# Patient Record
Sex: Female | Born: 1985 | Race: Black or African American | Hispanic: No | Smoking: Never smoker
Health system: Southern US, Community
[De-identification: ages and names within clinical notes are randomized; demographics above are authoritative.]

## PROBLEM LIST (undated history)

## (undated) ENCOUNTER — Inpatient Hospital Stay (HOSPITAL_COMMUNITY): Payer: Self-pay

## (undated) DIAGNOSIS — I10 Essential (primary) hypertension: Secondary | ICD-10-CM

## (undated) DIAGNOSIS — F419 Anxiety disorder, unspecified: Secondary | ICD-10-CM

## (undated) DIAGNOSIS — O1490 Unspecified pre-eclampsia, unspecified trimester: Secondary | ICD-10-CM

## (undated) DIAGNOSIS — I509 Heart failure, unspecified: Secondary | ICD-10-CM

## (undated) DIAGNOSIS — R011 Cardiac murmur, unspecified: Secondary | ICD-10-CM

## (undated) DIAGNOSIS — K449 Diaphragmatic hernia without obstruction or gangrene: Secondary | ICD-10-CM

## (undated) DIAGNOSIS — A549 Gonococcal infection, unspecified: Secondary | ICD-10-CM

## (undated) DIAGNOSIS — I5032 Chronic diastolic (congestive) heart failure: Secondary | ICD-10-CM

## (undated) DIAGNOSIS — K219 Gastro-esophageal reflux disease without esophagitis: Secondary | ICD-10-CM

## (undated) DIAGNOSIS — A749 Chlamydial infection, unspecified: Secondary | ICD-10-CM

## (undated) DIAGNOSIS — J811 Chronic pulmonary edema: Secondary | ICD-10-CM

## (undated) DIAGNOSIS — O24419 Gestational diabetes mellitus in pregnancy, unspecified control: Secondary | ICD-10-CM

## (undated) HISTORY — DX: Chronic diastolic (congestive) heart failure: I50.32

## (undated) HISTORY — DX: Cardiac murmur, unspecified: R01.1

## (undated) HISTORY — DX: Gastro-esophageal reflux disease without esophagitis: K21.9

## (undated) HISTORY — DX: Heart failure, unspecified: I50.9

## (undated) HISTORY — DX: Anxiety disorder, unspecified: F41.9

## (undated) HISTORY — PX: DILATION AND CURETTAGE OF UTERUS: SHX78

---
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
## 2007-11-14 ENCOUNTER — Inpatient Hospital Stay (HOSPITAL_COMMUNITY): Admission: AD | Admit: 2007-11-14 | Discharge: 2007-11-14 | Payer: Self-pay | Admitting: Obstetrics & Gynecology

## 2010-06-08 NOTE — Op Note (Signed)
[facility]  Patient:    Savannah Cole, Savannah Cole                        MRN: [id number] Proc. Date: 05/16/99 Adm. Date:  [id number] Attending:  Venita Sheffield                           Operative Report  PREOPERATIVE DIAGNOSIS:       Intrauterine fetal demise at 12 weeks.  POSTOPERATIVE DIAGNOSIS:  OPERATION:  SURGEON:                      Kathreen Cosier, M.D.  ASSISTANT:  ANESTHESIA:                   MAC.  ESTIMATED BLOOD LOSS:  DESCRIPTION OF PROCEDURE:     Using MAC with the patient in the lithotomy position, a laminaria plus packing was removed from the vagina.  The perineum and vagina ere prepped and draped.  The bladder was emptied with a straight catheter.  On bimanual examination, the uterus was 12 weeks size.  A weighted speculum was placed in the vagina.  The cervix was grasped with a tenaculum and the cervix was dilated at  [facility] with 1% Xylocaine 3 cc each.  The cervix was noted to be open and easily  admitted a #31 Pratt.  The sponge forceps were inserted and used to remove the products of conception.  There was a large amount of tissue obtained.  Then using a #11 suction, the endometrial cavity was suctioned and any remaining tissue removed. The patient tolerated the procedure well and taken to the recovery room in good  condition. DD:  05/16/99 TD:  05/16/99 Job: [id number]

## 2010-09-06 ENCOUNTER — Ambulatory Visit (INDEPENDENT_AMBULATORY_CARE_PROVIDER_SITE_OTHER): Payer: Commercial Managed Care - PPO | Admitting: Family Medicine

## 2010-09-06 ENCOUNTER — Ambulatory Visit: Payer: Self-pay | Admitting: Family Medicine

## 2010-09-06 ENCOUNTER — Encounter: Payer: Self-pay | Admitting: Family Medicine

## 2010-09-06 DIAGNOSIS — R000 Tachycardia, unspecified: Secondary | ICD-10-CM

## 2010-09-06 DIAGNOSIS — F411 Generalized anxiety disorder: Secondary | ICD-10-CM

## 2010-09-06 DIAGNOSIS — F419 Anxiety disorder, unspecified: Secondary | ICD-10-CM

## 2010-09-06 LAB — TSH: TSH: 0.59 u[IU]/mL (ref 0.35–5.50)

## 2010-09-06 LAB — CBC WITH DIFFERENTIAL/PLATELET
Basophils Relative: 0.4 % (ref 0.0–3.0)
Eosinophils Relative: 0.3 % (ref 0.0–5.0)
HCT: 39.7 % (ref 36.0–46.0)
Lymphs Abs: 2.1 10*3/uL (ref 0.7–4.0)
MCHC: 33.5 g/dL (ref 30.0–36.0)
MCV: 94 fl (ref 78.0–100.0)
Monocytes Absolute: 0.6 10*3/uL (ref 0.1–1.0)
Neutro Abs: 5.8 10*3/uL (ref 1.4–7.7)
RBC: 4.23 Mil/uL (ref 3.87–5.11)
WBC: 8.5 10*3/uL (ref 4.5–10.5)

## 2010-09-06 LAB — BASIC METABOLIC PANEL
CO2: 28 mEq/L (ref 19–32)
Chloride: 101 mEq/L (ref 96–112)
Potassium: 3.6 mEq/L (ref 3.5–5.1)

## 2010-09-06 LAB — HEPATIC FUNCTION PANEL
ALT: 20 U/L (ref 0–35)
Albumin: 4.7 g/dL (ref 3.5–5.2)
Total Protein: 8.2 g/dL (ref 6.0–8.3)

## 2010-09-06 MED ORDER — ESCITALOPRAM OXALATE 10 MG PO TABS: 10.0000 mg | ORAL_TABLET | Freq: Every day | ORAL | Status: DC

## 2010-09-06 MED ORDER — ALPRAZOLAM 0.25 MG PO TABS: 0.2500 mg | ORAL_TABLET | Freq: Three times a day (TID) | ORAL | Status: DC | PRN

## 2010-09-06 NOTE — Patient Instructions (Signed)
Anxiety and Panic Attacks Your caregiver has informed you that you are having an anxiety or panic attack. There may be many forms of this. Most of the time these attacks come suddenly and without warning. They come at any time of day, including periods of sleep, and at any time of life. They may be strong and unexplained. Although panic attacks are very scary, they are physically harmless. Sometimes the cause of your anxiety is not known. Anxiety is a protective mechanism of the body in its fight or flight mechanism. Most of these perceived danger situations are actually nonphysical situations (such as anxiety over losing a job). CAUSES The causes of an anxiety or panic attack are many. Panic attacks may occur in otherwise healthy people given a certain set of circumstances. There may be a genetic cause for panic attacks. Some medications may also have anxiety as a side effect. SYMPTOMS Some of the most common feelings are:  Intense terror.  Dizziness, feeling faint.   Hot and cold flashes.   Fear of going crazy.   Feelings that nothing is real.   Sweating.   Shaking.   Chest pain or a fast heartbeat (palpitations).  Smothering, choking sensations.   Feelings of impending doom and that death is near.   Tingling of extremities, this may be from over breathing.   Altered reality (derealization).   Being detached from yourself (depersonalization).   Several symptoms can be present to make up anxiety or panic attacks. DIAGNOSIS The evaluation by your caregiver will depend on the type of symptoms you are experiencing. The diagnosis of anxiety or pain attack is made when no physical illness can be determined to be a cause of the symptoms. TREATMENT Treatment to prevent anxiety and panic attacks may include:  Avoidance of circumstances that cause anxiety.   Reassurance and relaxation.   Regular exercise.   Relaxation therapies, such as yoga.   Psychotherapy with a psychiatrist  or therapist.   Avoidance of caffeine, alcohol and illegal drugs.   Prescribed medication.  SEEK IMMEDIATE MEDICAL CARE IF:  You experience panic attack symptoms that are different than your usual symptoms.   You have any worsening or concerning symptoms.  Document Released: 01/07/2005 Document Re-Released: 06/27/2009 Exit[employer] Patient Information 2011 [employer]. 

## 2010-09-06 NOTE — Progress Notes (Signed)
  Subjective:     Savannah Cole is a 25 y.o. female who presents for new evaluation and treatment of anxiety disorder. She has the following anxiety symptoms: difficulty concentrating, palpitations and panic attacks. Onset of symptoms was approximately 3 months ago. Symptoms have been gradually worsening since that time. She denies current suicidal and homicidal ideation. Family history significant for no psychiatric illness. Risk factors: negative life event daughter with autism and stressfull job. Previous treatment includes none. She complains of the following medication side effects: none. The following portions of the patient's history were reviewed and updated as appropriate: allergies, current medications, past family history, past medical history, past social history, past surgical history and problem list.  Review of Systems Pertinent items are noted in HPI.    Objective:    BP 132/72  Pulse 110  Ht 5' 4.5" (1.638 m)  Wt 171 lb (77.565 kg)  BMI 28.90 kg/m2 General appearance: alert, cooperative, appears stated age and no distress Neck: no adenopathy, no carotid bruit, no JVD, supple, symmetrical, trachea midline and thyroid not enlarged, symmetric, no tenderness/mass/nodules Lungs: clear to auscultation bilaterally Heart: regular rate and rhythm, S1, S2 normal, no murmur, click, rub or gallop    Assessment:    anxiety disorder. Possible organic contributing causes are: none.   Plan:    Medications: Lexapro. Labs: Comprehensive metabolic profile, CBC and TSH. Handouts describing disease, natural history, and treatment were given to the patient. Follow up: 1 month.

## 2010-09-07 ENCOUNTER — Encounter: Payer: Self-pay | Admitting: Family Medicine

## 2010-10-01 ENCOUNTER — Other Ambulatory Visit: Payer: Self-pay | Admitting: Family Medicine

## 2010-10-01 DIAGNOSIS — R7989 Other specified abnormal findings of blood chemistry: Secondary | ICD-10-CM

## 2010-10-02 ENCOUNTER — Other Ambulatory Visit (INDEPENDENT_AMBULATORY_CARE_PROVIDER_SITE_OTHER): Payer: Commercial Managed Care - PPO

## 2010-10-02 DIAGNOSIS — R7989 Other specified abnormal findings of blood chemistry: Secondary | ICD-10-CM

## 2010-10-02 LAB — BASIC METABOLIC PANEL
Calcium: 9.1 mg/dL (ref 8.4–10.5)
GFR: 135.29 mL/min (ref >60.00)
Glucose, Bld: 96 mg/dL (ref 70–99)
Sodium: 137 mEq/L (ref 135–145)

## 2010-10-02 LAB — HEMOGLOBIN A1C: Hgb A1c MFr Bld: 5.4 % (ref 4.6–6.5)

## 2010-10-02 NOTE — Progress Notes (Signed)
Labs only

## 2010-10-05 NOTE — Progress Notes (Signed)
Labs only

## 2010-10-11 ENCOUNTER — Ambulatory Visit: Payer: Commercial Managed Care - PPO | Admitting: Family Medicine

## 2010-10-11 DIAGNOSIS — Z0289 Encounter for other administrative examinations: Secondary | ICD-10-CM

## 2011-01-16 ENCOUNTER — Other Ambulatory Visit (HOSPITAL_COMMUNITY)
Admission: RE | Admit: 2011-01-16 | Discharge: 2011-01-16 | Disposition: A | Discharge status: Home / Self Care | Payer: Commercial Managed Care - PPO | Source: Ambulatory Visit | Attending: Obstetrics and Gynecology | Admitting: Obstetrics and Gynecology

## 2011-01-16 ENCOUNTER — Other Ambulatory Visit: Payer: Self-pay | Admitting: Obstetrics and Gynecology

## 2011-01-16 DIAGNOSIS — Z01419 Encounter for gynecological examination (general) (routine) without abnormal findings: Secondary | ICD-10-CM | POA: Insufficient documentation

## 2011-04-12 ENCOUNTER — Other Ambulatory Visit: Payer: Self-pay | Admitting: Obstetrics and Gynecology

## 2011-04-27 ENCOUNTER — Encounter (HOSPITAL_COMMUNITY): Payer: Self-pay | Admitting: *Deleted

## 2011-04-27 ENCOUNTER — Inpatient Hospital Stay (HOSPITAL_COMMUNITY)
Admission: AD | Admit: 2011-04-27 | Discharge: 2011-04-27 | Disposition: A | Discharge status: Home / Self Care | Payer: Commercial Managed Care - PPO | Source: Ambulatory Visit | Attending: Obstetrics and Gynecology | Admitting: Obstetrics and Gynecology

## 2011-04-27 DIAGNOSIS — N949 Unspecified condition associated with female genital organs and menstrual cycle: Secondary | ICD-10-CM | POA: Insufficient documentation

## 2011-04-27 DIAGNOSIS — N898 Other specified noninflammatory disorders of vagina: Secondary | ICD-10-CM

## 2011-04-27 DIAGNOSIS — L293 Anogenital pruritus, unspecified: Secondary | ICD-10-CM | POA: Insufficient documentation

## 2011-04-27 DIAGNOSIS — N899 Noninflammatory disorder of vagina, unspecified: Secondary | ICD-10-CM

## 2011-04-27 HISTORY — DX: Gonococcal infection, unspecified: A54.9

## 2011-04-27 HISTORY — DX: Chlamydial infection, unspecified: A74.9

## 2011-04-27 LAB — URINALYSIS, ROUTINE W REFLEX MICROSCOPIC
Bilirubin Urine: NEGATIVE
Glucose, UA: NEGATIVE mg/dL
Ketones, ur: NEGATIVE mg/dL
Leukocytes, UA: NEGATIVE
Protein, ur: NEGATIVE mg/dL
pH: 6 (ref 5.0–8.0)

## 2011-04-27 LAB — WET PREP, GENITAL
Clue Cells Wet Prep HPF POC: NONE SEEN
Trich, Wet Prep: NONE SEEN

## 2011-04-27 LAB — URINE MICROSCOPIC-ADD ON

## 2011-04-27 MED ORDER — METHYLPREDNISOLONE 4 MG PO KIT: PACK | ORAL | Status: AC
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
## 2011-04-27 NOTE — Discharge Instructions (Signed)
Your wet prep is normal tonight. The cultures will be back in 2 days. Someone will call you if they are positive. Take the medication as directed. You can take benadryl during the day for itching or burning but do not take it in addition to your tylenol pm at night. Follow up with Dr. Dion Body in the office. Return here as needed. DO NOT DOUCHE, DO NOT USE BUBBLE BATH OR SOAP WITH SCENTS, DO NOT USE TAMPONS. USE DOVE SOAP.

## 2011-04-27 NOTE — Progress Notes (Signed)
Pt reports she had IUD removed last week to see if that was the cause of the pain and infection.

## 2011-04-27 NOTE — MAU Note (Signed)
Pt reports having dx with gonorrhea and chlymidia. Treated for both.  Still having symptoms and tested positive for chlymidia ( no knew sexual encounters) Treated with another antibiotic. P tstill having  Symptoms. MD told her to come here for second opinion.

## 2011-04-29 LAB — URINE CULTURE
Colony Count: NO GROWTH
Culture: NO GROWTH

## 2011-04-30 ENCOUNTER — Telehealth (HOSPITAL_COMMUNITY): Payer: Self-pay | Admitting: Nurse Practitioner

## 2011-04-30 NOTE — Telephone Encounter (Signed)
Telephone call to patient regarding positive chlamydia culture, patient notified.  Patient has been treated three times for chlamydia and continues to test positive.  Patient states she is not having sex.  Patient has appointment tomorrow with Dr. Dion Body and will discuss with her, her treatment options. Report faxed to health department.

## 2012-01-22 NOTE — L&D Delivery Note (Signed)
Delivery Note    Called to bedside for delivery.  Upon arrival, pt was crowning.  Pt pushed for ~3 sets.  At 4:58 AM a viable female was delivered via VBAC, Spontaneous (Presentation: ; Occiput Anterior).  APGAR: 1, 6; weight 7 lb 0.7 oz (3195 g).   Placenta status: Intact, Spontaneous Pathology.  Cord: 3 vessels with the following complications: None.  Cord pH: 7.09. Nose and mouth suctioned on perineum. Tight nuchal cord, surgically reduced.  Shoulders delivered easily.  Baby floppy placed at bedside.  NICU called.  Minimal UOP noted during labor.  Foley to gravity placed with sterile technique for strict I/Os.  Urine concentrated, 250 cc.  Anesthesia: Epidural  Episiotomy: None Lacerations: None Suture Repair: n/a Est. Blood Loss (mL): 300  Mom to AICU.  Baby to nursery-stable.  Geryl Rankins 08/16/2012, 6:23 AM

## 2012-01-30 ENCOUNTER — Other Ambulatory Visit: Payer: Self-pay

## 2012-01-30 LAB — OB RESULTS CONSOLE HIV ANTIBODY (ROUTINE TESTING): HIV: NONREACTIVE

## 2012-01-30 LAB — OB RESULTS CONSOLE GC/CHLAMYDIA
Chlamydia: NEGATIVE
Gonorrhea: NEGATIVE

## 2012-02-03 ENCOUNTER — Other Ambulatory Visit: Payer: Self-pay | Admitting: Obstetrics and Gynecology

## 2012-02-03 DIAGNOSIS — O2690 Pregnancy related conditions, unspecified, unspecified trimester: Secondary | ICD-10-CM

## 2012-02-05 ENCOUNTER — Ambulatory Visit (HOSPITAL_COMMUNITY): Payer: Commercial Managed Care - PPO

## 2012-02-06 ENCOUNTER — Ambulatory Visit (HOSPITAL_COMMUNITY)
Admission: RE | Admit: 2012-02-06 | Discharge: 2012-02-06 | Disposition: A | Discharge status: Home / Self Care | Payer: Commercial Managed Care - PPO | Source: Ambulatory Visit | Attending: Obstetrics and Gynecology | Admitting: Obstetrics and Gynecology

## 2012-02-06 ENCOUNTER — Ambulatory Visit (HOSPITAL_COMMUNITY): Payer: Commercial Managed Care - PPO

## 2012-02-06 ENCOUNTER — Encounter (HOSPITAL_COMMUNITY): Payer: Self-pay

## 2012-02-06 DIAGNOSIS — O2620 Pregnancy care for patient with recurrent pregnancy loss, unspecified trimester: Secondary | ICD-10-CM

## 2012-02-06 DIAGNOSIS — O2690 Pregnancy related conditions, unspecified, unspecified trimester: Secondary | ICD-10-CM | POA: Insufficient documentation

## 2012-02-06 NOTE — Consult Note (Signed)
Maternal Fetal Medicine Consultation  Requesting Provider(s): Geryl Rankins, MD  Reason for consultation:  Recurrent pregnancy loss  HPI: Savannah Cole is a 27 yo [id number] currently at 26 1/7 weeks who is seen today for consultation due to history of recurrent pregnancy loss.  Her past OB history is as follows:  1) 2000 - SAB at approx 12 weeks, required D&C 2) 2002 - term C-section for non-reassuring fetal tracing, ? Preeclampsia 3) 2005 - SAB at 8 weeks 4) 2008 - SAB at 12 weeks 5) 2009 - SAB at 8 weeks  Savannah Cole recently had a clinic ultrasound at 8 weeks that documented a fetal heart beat.  She is currently without complaints - denies any vaginal bleeding or cramping.  She is currently on Prometrium supplementation. Her current pregnancy is with a different father from her first delivery.  OB History: OB History    Grav Para Term Preterm Abortions TAB SAB Ect Mult Living   5 1 1  3  3   1       PMH:  Past Medical History  Diagnosis Date  . Anxiety   . Gonorrhea   . Chlamydia     PSH:  Past Surgical History  Procedure Date  . Cesarean section   . Dilation and curettage of uterus     due to miscarriage   Meds: Prometrium, Tylenol PM, OTC nausea medications  Allergies: NKDA  FH: daughter with "severe" autism.  Reports that genetics work up thus far has been unrevealing.  Soc: denies tobacco, ETOH or illicit drug use  Review of Systems: no vaginal bleeding or cramping/contractions, no LOF, no nausea/vomiting. All other systems reviewed and are negative.    PE:  129/79, 83, 180#   A/P: 1) Single IUP at 9 1/7 weeks         2) Hx of recurrent pregnancy loss - we discussed the differential diagnosis / work up for recurrent pregnancy loss. Concur with progesterone supplementation through the first trimester for possible luteal phase abnormality.  The fact that the patient had a previous livebirth lowers her risk somewhat.  At this point, would not recommend  parental karyotypes and the full work up - but would recommend an antiphospholipid evaluation - Anticardiolipin antibodies, lupus anticoagulant and beta-2 glycoproteins antibodies were ordered at the time of the patients evaluation. Recommend follow up ultrasound at 18 weeks for anatomy which was scheduled at the patient's clinic visit.          3) History of GDM with first pregnancy - recommend early screening test          4) History of preeclampsia with first pregnancy - recommend baseline preeclampsia labs and 24-hr urine protein.   Thank you for the opportunity to be a part of the care of Savannah Cole. Please contact our office if we can be of further assistance.   I spent approximately 30 minutes with this patient with over 50% of time spent in face-to-face counseling.  Alpha Gula, MD Maternal Fetal Medicine

## 2012-02-07 LAB — BETA-2-GLYCOPROTEIN I ABS, IGG/M/A: Beta-2 Glyco I IgG: 0 G Units (ref <20)

## 2012-02-07 LAB — LUPUS ANTICOAGULANT PANEL
DRVVT: 32.7 secs (ref <42.9)
Lupus Anticoagulant: NOT DETECTED

## 2012-02-07 LAB — CARDIOLIPIN ANTIBODIES, IGG, IGM, IGA
Anticardiolipin IgA: 4 APL U/mL — ABNORMAL LOW (ref <22)
Anticardiolipin IgM: 5 MPL U/mL — ABNORMAL LOW (ref <11)

## 2012-02-12 ENCOUNTER — Telehealth (HOSPITAL_COMMUNITY): Payer: Self-pay | Admitting: *Deleted

## 2012-02-13 ENCOUNTER — Telehealth (HOSPITAL_COMMUNITY): Payer: Self-pay | Admitting: *Deleted

## 2012-02-13 NOTE — Telephone Encounter (Signed)
Patient called back today for lab results.  Reviewed with Dr. Sherrie George and found to be wnl.  Pt verbalized understanding.

## 2012-02-14 ENCOUNTER — Other Ambulatory Visit (HOSPITAL_COMMUNITY)
Admission: RE | Admit: 2012-02-14 | Discharge: 2012-02-14 | Disposition: A | Discharge status: Home / Self Care | Payer: Commercial Managed Care - PPO | Source: Ambulatory Visit | Attending: Obstetrics and Gynecology | Admitting: Obstetrics and Gynecology

## 2012-02-14 ENCOUNTER — Other Ambulatory Visit: Payer: Self-pay | Admitting: Obstetrics and Gynecology

## 2012-02-14 DIAGNOSIS — Z01419 Encounter for gynecological examination (general) (routine) without abnormal findings: Secondary | ICD-10-CM | POA: Insufficient documentation

## 2012-04-07 ENCOUNTER — Other Ambulatory Visit: Payer: Self-pay | Admitting: Obstetrics and Gynecology

## 2012-04-07 DIAGNOSIS — Z0489 Encounter for examination and observation for other specified reasons: Secondary | ICD-10-CM

## 2012-04-09 ENCOUNTER — Ambulatory Visit (HOSPITAL_COMMUNITY)
Admission: RE | Admit: 2012-04-09 | Discharge: 2012-04-09 | Disposition: A | Discharge status: Home / Self Care | Payer: Commercial Managed Care - PPO | Source: Ambulatory Visit | Attending: Obstetrics and Gynecology | Admitting: Obstetrics and Gynecology

## 2012-04-09 DIAGNOSIS — O358XX0 Maternal care for other (suspected) fetal abnormality and damage, not applicable or unspecified: Secondary | ICD-10-CM | POA: Insufficient documentation

## 2012-04-09 DIAGNOSIS — Z1389 Encounter for screening for other disorder: Secondary | ICD-10-CM | POA: Insufficient documentation

## 2012-04-09 DIAGNOSIS — O10019 Pre-existing essential hypertension complicating pregnancy, unspecified trimester: Secondary | ICD-10-CM | POA: Insufficient documentation

## 2012-04-09 DIAGNOSIS — Z363 Encounter for antenatal screening for malformations: Secondary | ICD-10-CM | POA: Insufficient documentation

## 2012-05-24 ENCOUNTER — Inpatient Hospital Stay (HOSPITAL_COMMUNITY)
Admission: AD | Admit: 2012-05-24 | Discharge: 2012-05-24 | Disposition: A | Discharge status: Home / Self Care | Payer: Commercial Managed Care - PPO | Source: Ambulatory Visit | Attending: Obstetrics and Gynecology | Admitting: Obstetrics and Gynecology

## 2012-05-24 ENCOUNTER — Inpatient Hospital Stay (HOSPITAL_COMMUNITY): Payer: Commercial Managed Care - PPO

## 2012-05-24 ENCOUNTER — Encounter (HOSPITAL_COMMUNITY): Payer: Self-pay | Admitting: *Deleted

## 2012-05-24 DIAGNOSIS — Y92410 Unspecified street and highway as the place of occurrence of the external cause: Secondary | ICD-10-CM | POA: Insufficient documentation

## 2012-05-24 DIAGNOSIS — O99891 Other specified diseases and conditions complicating pregnancy: Secondary | ICD-10-CM | POA: Insufficient documentation

## 2012-05-24 HISTORY — DX: Gestational diabetes mellitus in pregnancy, unspecified control: O24.419

## 2012-05-24 HISTORY — DX: Unspecified pre-eclampsia, unspecified trimester: O14.90

## 2012-05-24 HISTORY — DX: Essential (primary) hypertension: I10

## 2012-05-24 LAB — CBC
HCT: 36.5 % (ref 36.0–46.0)
Hemoglobin: 12.4 g/dL (ref 12.0–15.0)
MCH: 30.1 pg (ref 26.0–34.0)
MCHC: 34 g/dL (ref 30.0–36.0)
RDW: 12.9 % (ref 11.5–15.5)

## 2012-05-24 LAB — APTT: aPTT: 26 seconds (ref 24–37)

## 2012-05-24 LAB — FIBRINOGEN: Fibrinogen: 451 mg/dL (ref 204–475)

## 2012-05-24 MED ORDER — LABETALOL HCL 100 MG PO TABS: ORAL_TABLET | ORAL | Status: DC
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
## 2012-05-25 LAB — ABO/RH: ABO/RH(D): B POS
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
## 2012-06-04 LAB — OB RESULTS CONSOLE HEPATITIS B SURFACE ANTIGEN: Hepatitis B Surface Ag: NEGATIVE

## 2012-06-04 LAB — OB RESULTS CONSOLE RPR: RPR: NONREACTIVE

## 2012-08-07 ENCOUNTER — Inpatient Hospital Stay (HOSPITAL_COMMUNITY)
Admission: AD | Admit: 2012-08-07 | Discharge: 2012-08-18 | DRG: 774 | Disposition: A | Discharge status: Home / Self Care | Payer: Commercial Managed Care - PPO | Source: Ambulatory Visit | Attending: Obstetrics and Gynecology | Admitting: Obstetrics and Gynecology

## 2012-08-07 ENCOUNTER — Encounter (HOSPITAL_COMMUNITY): Payer: Self-pay | Admitting: *Deleted

## 2012-08-07 ENCOUNTER — Observation Stay (HOSPITAL_COMMUNITY): Payer: Commercial Managed Care - PPO

## 2012-08-07 ENCOUNTER — Other Ambulatory Visit: Payer: Self-pay | Admitting: Obstetrics and Gynecology

## 2012-08-07 DIAGNOSIS — O119 Pre-existing hypertension with pre-eclampsia, unspecified trimester: Secondary | ICD-10-CM | POA: Diagnosis present

## 2012-08-07 DIAGNOSIS — IMO0002 Reserved for concepts with insufficient information to code with codable children: Principal | ICD-10-CM | POA: Diagnosis present

## 2012-08-07 DIAGNOSIS — I10 Essential (primary) hypertension: Secondary | ICD-10-CM | POA: Diagnosis present

## 2012-08-07 DIAGNOSIS — O34219 Maternal care for unspecified type scar from previous cesarean delivery: Secondary | ICD-10-CM | POA: Diagnosis present

## 2012-08-07 MED ORDER — CALCIUM CARBONATE ANTACID 500 MG PO CHEW
2.0000 | CHEWABLE_TABLET | ORAL | Status: DC | PRN
  Administered 2012-08-07: 400 mg via ORAL
  Filled 2012-08-07: qty 2

## 2012-08-07 MED ORDER — ACETAMINOPHEN 325 MG PO TABS
650.0000 mg | ORAL_TABLET | ORAL | Status: DC | PRN
  Administered 2012-08-09 – 2012-08-15 (×14): 650 mg via ORAL
  Filled 2012-08-07 (×15): qty 2

## 2012-08-07 MED ORDER — ZOLPIDEM TARTRATE 5 MG PO TABS
5.0000 mg | ORAL_TABLET | Freq: Every evening | ORAL | Status: DC | PRN
  Administered 2012-08-08 – 2012-08-14 (×8): 5 mg via ORAL
  Filled 2012-08-07 (×8): qty 1

## 2012-08-07 MED ORDER — DOCUSATE SODIUM 100 MG PO CAPS
100.0000 mg | ORAL_CAPSULE | Freq: Every day | ORAL | Status: DC
  Administered 2012-08-08 – 2012-08-15 (×7): 100 mg via ORAL
  Filled 2012-08-07 (×9): qty 1

## 2012-08-07 MED ORDER — PRENATAL MULTIVITAMIN CH
1.0000 | ORAL_TABLET | Freq: Every day | ORAL | Status: DC
  Administered 2012-08-08 – 2012-08-15 (×8): 1 via ORAL
  Filled 2012-08-07 (×9): qty 1

## 2012-08-07 NOTE — Progress Notes (Signed)
Discussed with pt her desire for no transfusions during the course of her admission. The risks and consequences of this decision d/t her diagnosis were clearly explained with teach back methods, it was made clear while the choice is ultimately hers to make and the staff and physicians would respect her wishes completely. The patient stated with conviction that if it came to a life and death situation she would want the physicians to do what was necessary to sustain her life at this point the patient refused to sign the "Refusal for all Blood or Blood Product" form.

## 2012-08-07 NOTE — Progress Notes (Signed)
Patient ID: [name], female   DOB: 06/25/1985, 27 y.o.   MRN: [id number] Savannah Cole is a 27 y.o. [id number] at [redacted]w[redacted]d by ultrasound admitted for r/o pre-eclampsia.  Pt has chronic hypertension which never required medication prior to pregnancy.  She states she has not taken her prescribed labetalol for at least 2 months.  Subjective: She denies current headache, visual changes, upper abdominal pain, nausea or vomiting.        Objective: BP 142/87  Pulse 97  Temp(Src) 98.5 F (36.9 C) (Oral)  Resp 20  Ht 5\' 5"  (1.651 m)  Wt 217 lb 8 oz (98.657 kg)  BMI 36.19 kg/m2  LMP 12/04/2011   Labs: Lab Results  Component Value Date   WBC 12.1* 05/24/2012   HGB 12.4 05/24/2012   HCT 36.5 05/24/2012   MCV 88.6 05/24/2012   PLT 307 05/24/2012    Assessment / Plan IUP at 35 wks 4 days Chronic hypertension currently on no meds, with elevated bp on today's evaluation Hx pre-eclampsia first pregnancy Hx prior c/s for failed induction for pre-eclampsia at term High risk for [facility] Desires TOLAC for attempted vaginal birth.  States she and Dr. Gunnar Bulla have discussed this option in detail along with the associated risks and benefits.  She states that she understands that if she requires delivery with an unfavorable cervix, she may be advised that repeat C/S is her best option. Desires no blood transfusion unless absolutely necessary.  We discussed her risk for PPH and that we would prepare the appropriate samples to have availble for life threatening bleeding.  PLAN Repeat PIH labs in am Restart Labetalol for BP consistently >150/90 Growth U/S in am Discussed possible need of C/S and transfusion.Pt 's questions were answered  Fetal Wellbeing:  Category I   Savannah Cole P 08/07/2012, 9:09 PM

## 2012-08-07 NOTE — Progress Notes (Signed)
Patient currently does not want to sign the Refusal of all blood and blood product form. She states" in a life and death situation I desire the MD to make the decision of need.

## 2012-08-07 NOTE — H&P (Signed)
Reason for Appointment  1. 1 week OB/NST   History of Present Illness   General:  27 y/o G6 P1041 at 32 2/7 weeks confirmed with an 11 week ultrasound presented for routine twice weekly NST. Pt with c/o of sudden lower extremity swelling. 2+ protein urine dip, pt perviously dipped negative. Baseline 24 hour urine was 151. Denies RUQ, visual changes. Headaches are intermittent but she has had headaches throughout pregnancy. Good fetal movement. Pt with significant fatigue but has not worsened from previous. She denies SOB at rest but had difficulty catching her breath this am but that resolved. Denies LOF, VB. Pregnancy complicated by HTN diagnosed in the first trimester. As high as 160s. Labetalol 100 mg BID started but pt discontinued due to headaches. Without medication 120s-140s/80s-90s. BP increased so Labetalol was resumed at 50 mg BID. Pt states she has not taken her BP in the last few days. H/o recurrent miscarriage, S/p Prometrium in the first trimester.   Current Medications   Taking CitraNatal DHA 27-1 & 250 MG Miscellaneous 1 tablet Once daily  Taking Prenatal Vitamins 0.8 MG Tablet as directed   Taking Prometrium 100 MG Capsule as directed 2, Notes: Discontinued after the first trimester  Taking Zofran ODT 4 MG Tablet Dispersible as directed Take 1 tablet every 8 hours, Notes: 1st trimester  Taking Diflucan 150 MG Tablet 1 tablet Once a day  Taking Terazol 3 0.8 % Cream 1 application at bedtime Once a day  Discontinued Labetalol HCl 100 MG Tablet 1 tablet every 12 hours   Past Medical History  Vulvadynia  H/o Gestational DM 2002   Surgical History   D&C 2002  C/S 2002   Family History   Father: alive, Hypertension  Mother: alive  Paternal Grand Father: deceased  Paternal Grand Mother: alive, Diabetes  Maternal Grand Father: alive  Maternal Grand Mother: alive, Diabetes, Hypertension, stroke  Brother 1: alive  Sister 1: alive  Sister 2: alive  Sister 3: alive   denies any GYN family cancer hx.   Social History   General:  Tobacco use  cigarettes: Never smoked no Smoking.  no Tobacco Exposure.  no Alcohol.  Caffeine: yes, coffee, rare, soda, tea.  no Recreational drug use.  Occupation: employed, [employer]- CMA.  Marital Status: Separated.  Children: 1, girls.  Seat belt use: yes.    Gyn History   Sexual activity sexually active.  Periods : regular.  LMP 12/02/11.  Birth control none.  Last pap smear date 01/16/11, WNL.  Denies H/O Last mammogram date.  Abnormal pap smear no.  STD Chlamydia, Gonorrhea.  Menarche 12.    OB History   Number of pregnancies 6.  Pregnancy # 1 miscarriage 2001.  Pregnancy # 2 live birth, C-section, girl 02/2000.  Pregnancy # 3 miscarriage 2009.  Pregnancy # 4: miscarriage 2009.  Pregnancy # 5: Miscarriage 2010.  Pregnancy # 6 current.    Hospitalization/Major Diagnostic Procedure  child birth 02/2000   Vital Signs   Wt 218, Wt change 38 lb, Pulse sitting 130/90, BP sitting 140/90 (repeat).   Physical Examination  GENERAL:  Patient appears alert and oriented.  General Appearance: well-appearing, well-developed, no acute distress.  Speech: clear.  LUNGS:  General clear bilaterally, no crackles, no wheezes.  HEART:  Heart sounds: normal, RRR.  ABDOMEN:  General: Gravid, no RUQ tenderness.  FEMALE GENITOURINARY:  Cervix Closed, Ext os-FT,mod consistency, midposition.  EXTREMITIES:  General: 2+ edema, no calf tenderness.  NEUROLOGICAL:  Reflexes: 2+ bilaterally  and symmetric.    Assessments  1. HTN in pregnancy, chronic - 642.00 (Primary), R/o superimposed preeclampsia., H/o Preeclampsia  2. Proteinuria complicating pregnancy in third trimester - 646.23, 2+ in office.  Treatment  1. HTN in pregnancy, chronic  LAB: CBC without Diff LAB: Comp Metabolic Panel LAB: Uric Acid LAB: LDH (161096)  LAB: Total Protein/Creat Urine (Random) (045409)  LAB: 24-Hr Protein Urine  ([id number]) (Ordered for 08/10/2012)  Notes: Admit to [facility] for observation. Fetal monitoring. Ultrasound ordered to assess EFW and AFI. Pt counseled on management plan, bedrest and 24 hour urine collection. Pt desires to VBAC. Counseled if delivery recommended due to severe preeclampsia, repeat c-section would be advised due to unfavorable cervix.   Procedures  Venipuncture:  Venipuncture: Smith,Michele 08/07/2012 12:42:22 PM > , performed in right arm.          Labs  Lab: CBC without Diff  WBC 8.7  4.0-11.0 - K/ul  RBC 4.37  4.20-5.40 - M/uL  HCT 38.9  37.0-47.0 - %  HGB 13.0  12.0-16.0 - g/dL  MCV 78.2  95.6-21.3 - fL  MCH 29.7  27.0-33.0 - pg  MCHC 33.4  32.0-36.0 - g/dL  RDW 08.6  57.8-46.9 - %  PLT 210  150-400 - K/uL          Lab: Comp Metabolic Panel  GLUCOSE 96  70-99 - mg/dL  BUN 8  6-29 - mg/dL  CREATININE 5.28 L 4.13-2.44 - mg/dl  eGFR (NON-AFRICAN AMERICAN) 125  >60 - calc  eGFR (AFRICAN AMERICAN) 151  >60 - calc  SODIUM 134 L 136-145 - mmol/L  POTASSIUM 4.1  3.5-5.5 - mmol/L  CHLORIDE 105  98-107 - mmol/L  C02 22  22-32 - mmol/L  ANION GAP 11.7  6.0-20.0 - mmol/L  CALCIUM 9.6  8.6-10.3 - mg/dL  T PROTEIN 6.1  0.1-0.2 - g/dL  ALBUMIN 3.3 L 7.2-5.3 - g/dL  T.BILI 0.4  0.3-1.0 - mg/dL  ALP 664  40-347 - U/L  AST 30  0-39 - U/L  ALT 33  0-52 - U/L          Lab: Uric Acid  URIC ACID 5.2   2.3-6.6 - mg/dL

## 2012-08-08 LAB — COMPREHENSIVE METABOLIC PANEL
ALT: 28 U/L (ref 0–35)
AST: 28 U/L (ref 0–37)
Albumin: 2.3 g/dL — ABNORMAL LOW (ref 3.5–5.2)
Calcium: 9.4 mg/dL (ref 8.4–10.5)
Chloride: 102 mEq/L (ref 96–112)
Creatinine, Ser: 0.58 mg/dL (ref 0.50–1.10)
Sodium: 135 mEq/L (ref 135–145)
Total Bilirubin: 0.2 mg/dL — ABNORMAL LOW (ref 0.3–1.2)

## 2012-08-08 LAB — CBC
MCV: 86.9 fL (ref 78.0–100.0)
Platelets: 201 10*3/uL (ref 150–400)
RBC: 4.2 MIL/uL (ref 3.87–5.11)
RDW: 14.1 % (ref 11.5–15.5)
WBC: 7.5 10*3/uL (ref 4.0–10.5)

## 2012-08-08 LAB — CREATININE CLEARANCE, URINE, 24 HOUR
Collection Interval-CRCL: 24 hours
Creatinine Clearance: 206 mL/min — ABNORMAL HIGH (ref 75–115)
Creatinine, 24H Ur: 1718 mg/d (ref 700–1800)
Creatinine, Urine: 169.31 mg/dL
Creatinine: 0.58 mg/dL (ref 0.50–1.10)

## 2012-08-08 LAB — PROTEIN, URINE, 24 HOUR
Protein, 24H Urine: 881 mg/d — ABNORMAL HIGH (ref 50–100)
Urine Total Volume-UPROT: 1115 mL

## 2012-08-08 MED ORDER — LACTATED RINGERS IV SOLN
INTRAVENOUS | Status: DC
  Administered 2012-08-08: 21:00:00 via INTRAVENOUS
  Administered 2012-08-08: 125 mL/h via INTRAVENOUS
  Administered 2012-08-09 – 2012-08-15 (×2): via INTRAVENOUS

## 2012-08-08 MED ORDER — FAMOTIDINE 20 MG PO TABS
20.0000 mg | ORAL_TABLET | Freq: Two times a day (BID) | ORAL | Status: DC
  Administered 2012-08-08 – 2012-08-15 (×14): 20 mg via ORAL
  Filled 2012-08-08 (×13): qty 1

## 2012-08-08 MED ORDER — LACTATED RINGERS IV BOLUS (SEPSIS)
500.0000 mL | Freq: Once | INTRAVENOUS | Status: AC
  Administered 2012-08-08: 500 mL via INTRAVENOUS

## 2012-08-08 MED ORDER — RANITIDINE HCL 150 MG/10ML PO SYRP: 150.0000 mg | ORAL_SOLUTION | Freq: Two times a day (BID) | ORAL | Status: DC

## 2012-08-08 NOTE — Progress Notes (Signed)
24 hr. Urine complete for creatinine and protein.  Sent to lab.

## 2012-08-08 NOTE — Progress Notes (Signed)
OOB to BR.  FM off.

## 2012-08-08 NOTE — Progress Notes (Signed)
Pt. To be on strict I&O.  Will start IVF.  Encouraged to drink more fluids.

## 2012-08-08 NOTE — Progress Notes (Signed)
Notified Dr. Pennie Rushing of frequent UCs..  Encouraged pt. To drink lots of water.  Dr. Pennie Rushing to come in to examine pt. This afternoon.

## 2012-08-08 NOTE — Progress Notes (Signed)
Patient ID: Savannah Cole, female   DOB: 04-Feb-1985, 27 y.o.   MRN: [id number] Savannah Cole is a 27 y.o. G4W1027 at [redacted]w[redacted]d by LMP admitted for r/o pre eclampsia  Subjective: GI: heartburn or reflux          Objective: BP 139/92  Pulse 108  Temp(Src) 99 F (37.2 C) (Oral)  Resp 20  Ht 5\' 5"  (1.651 m)  Wt 217 lb 8 oz (98.657 kg)  BMI 36.19 kg/m2  SpO2 100%  LMP 12/04/2011 UC:   Fewer contractions since bolus of IV fluids SVE:   Dilation: Fingertip Effacement (%): Thick Exam by:: Haygood  Labs: Lab Results  Component Value Date   WBC 7.5 08/08/2012   HGB 12.2 08/08/2012   HCT 36.5 08/08/2012   MCV 86.9 08/08/2012   PLT 201 08/08/2012  .results  Assessment / Plan: IUP at [redacted]w[redacted]d Mild pre-eclampsia superimposed on chronic hypertension which has required no medication  Prior c/s with desire for TOLAC and VBAC Unfavorable cervix Preterm contractions, mild and without cervical change Appropriately grown fetus Fetal Wellbeing:  Category I  Plan VBAC consent form given to the pt and discussed risks of TOLAC especially risk of uterine rupture with induction of labor Options for management of mild preeclampsia reviewed: 1) delivery by repeat c/s  2) induction of labor accepting the increased risk of c/s because of unfavorable cervix and increased risk of uterine scar dehiscence/rupture because of induction 3) await spontaneous labor as long as no signs or sx of severe pre eclpampsia occur in which case immediate delivery would be recommended. Pt wants to review VBAC consent and discuss with her fiance'  HAYGOOD,VANESSA P 08/08/2012, 7:20 PM

## 2012-08-08 NOTE — Progress Notes (Addendum)
Patient ID: Savannah Cole, female   DOB: 1985/07/20, 27 y.o.   MRN: [id number] Savannah Cole is a 27 y.o. [id number] at [redacted]w[redacted]d by LMP admitted for rule out preeclampsia  Subjective: GI: Denies nausea, vomiting or diarrhea., Complains of, heartburn and reflux GU: Denies: dysuria, frequency/urgency, genital discharge, vaginal bleeding.  Denies any change in the pelvic pain that she has experienced throughout OB: Good fetal movement denies specific contractions        Objective: BP 139/92  Pulse 101  Temp(Src) 98.8 F (37.1 C) (Oral)  Resp 20  Ht 5\' 5"  (1.651 m)  Wt 217 lb 8 oz (98.657 kg)  BMI 36.19 kg/m2  LMP 12/04/2011    BP:132-139/63-92 without meds  FHT:  FHR: 150s bpm, variability: moderate,  accelerations:  Present,  decelerations:  Absent UC:   irregular, every 3 and minutes.  Pt rates them 1-2/10 SVE:   Dilation: Fingertip Effacement (%): Thick Exam by:: Haygood  Labs: Lab Results  Component Value Date   WBC 7.5 08/08/2012   HGB 12.2 08/08/2012   HCT 36.5 08/08/2012   MCV 86.9 08/08/2012   PLT 201 08/08/2012   Results for orders placed during the hospital encounter of 08/07/12 (from the past 24 hour(s))  TYPE AND SCREEN     Status: None   Collection Time    08/07/12  8:45 PM      Result Value Range   ABO/RH(D) B POS     Antibody Screen POS     Sample Expiration 08/10/2012     Antibody Identification NO CLINICALLY SIGNIFICANT ANTIBODY IDENTIFIED.     DAT, IgG NEG     Unit Number [id number]     Blood Component Type RED CELLS,LR     Unit division 00     Status of Unit REL FROM [facility]     Transfusion Status OK TO TRANSFUSE     Crossmatch Result COMPATIBLE     Unit Number [id number]     Blood Component Type RED CELLS,LR     Unit division 00     Status of Unit ALLOCATED     Transfusion Status OK TO TRANSFUSE     Crossmatch Result COMPATIBLE    CBC     Status: None   Collection Time    08/08/12  7:13 AM      Result Value Range   WBC 7.5  4.0 - 10.5  K/uL   RBC 4.20  3.87 - 5.11 MIL/uL   Hemoglobin 12.2  12.0 - 15.0 g/dL   HCT 03.4  74.2 - 59.5 %   MCV 86.9  78.0 - 100.0 fL   MCH 29.0  26.0 - 34.0 pg   MCHC 33.4  30.0 - 36.0 g/dL   RDW 63.8  75.6 - 43.3 %   Platelets 201  150 - 400 K/uL  COMPREHENSIVE METABOLIC PANEL     Status: Abnormal   Collection Time    08/08/12  7:13 AM      Result Value Range   Sodium 135  135 - 145 mEq/L   Potassium 3.7  3.5 - 5.1 mEq/L   Chloride 102  96 - 112 mEq/L   CO2 22  19 - 32 mEq/L   Glucose, Bld 112 (*) 70 - 99 mg/dL   BUN 8  6 - 23 mg/dL   Creatinine, Ser 2.95  0.50 - 1.10 mg/dL   Calcium 9.4  8.4 - 18.8 mg/dL   Total Protein 5.2 (*) 6.0 - 8.3 g/dL  Albumin 2.3 (*) 3.5 - 5.2 g/dL   AST 28  0 - 37 U/L   ALT 28  0 - 35 U/L   Alkaline Phosphatase 113  39 - 117 U/L   Total Bilirubin 0.2 (*) 0.3 - 1.2 mg/dL   GFR calc non Af Amer >90  >90 mL/min   GFR calc Af Amer >90  >90 mL/min  Urine protein pending U/S final reading pending Assessment / Plan: Chronic hypertension rule out superimposed preeclampsia  Fetal Wellbeing:  Category I  PLAN: Await outstanding labs Give IV fluid bolus to try to decrease asymptomatic contraction pattern  HAYGOOD,VANESSA P 08/08/2012, 4:16 PM

## 2012-08-08 NOTE — Progress Notes (Signed)
FM re-applied and assessing fetus and ctx.

## 2012-08-09 LAB — COMPREHENSIVE METABOLIC PANEL
ALT: 26 U/L (ref 0–35)
ALT: 23 U/L (ref 0–35)
AST: 25 U/L (ref 0–37)
Albumin: 2.4 g/dL — ABNORMAL LOW (ref 3.5–5.2)
Alkaline Phosphatase: 115 U/L (ref 39–117)
BUN: 9 mg/dL (ref 6–23)
CO2: 20 mEq/L (ref 19–32)
CO2: 21 mEq/L (ref 19–32)
Calcium: 9.5 mg/dL (ref 8.4–10.5)
Chloride: 102 mEq/L (ref 96–112)
Creatinine, Ser: 0.68 mg/dL (ref 0.50–1.10)
GFR calc Af Amer: >90 mL/min (ref >90)
GFR calc non Af Amer: >90 mL/min (ref >90)
GFR calc non Af Amer: >90 mL/min (ref >90)
Glucose, Bld: 118 mg/dL — ABNORMAL HIGH (ref 70–99)
Potassium: 3.9 mEq/L (ref 3.5–5.1)
Sodium: 133 mEq/L — ABNORMAL LOW (ref 135–145)
Sodium: 132 mEq/L — ABNORMAL LOW (ref 135–145)
Total Bilirubin: 0.2 mg/dL — ABNORMAL LOW (ref 0.3–1.2)
Total Protein: 5.8 g/dL — ABNORMAL LOW (ref 6.0–8.3)
Total Protein: 5.8 g/dL — ABNORMAL LOW (ref 6.0–8.3)

## 2012-08-09 LAB — CBC
HCT: 36.1 % (ref 36.0–46.0)
Hemoglobin: 12.5 g/dL (ref 12.0–15.0)
Hemoglobin: 12.2 g/dL (ref 12.0–15.0)
MCH: 29.8 pg (ref 26.0–34.0)
MCHC: 34.3 g/dL (ref 30.0–36.0)
MCV: 86.7 fL (ref 78.0–100.0)
Platelets: 203 10*3/uL (ref 150–400)
RBC: 4.2 MIL/uL (ref 3.87–5.11)
RDW: 14.1 % (ref 11.5–15.5)
WBC: 9.3 10*3/uL (ref 4.0–10.5)

## 2012-08-09 LAB — OB RESULTS CONSOLE GBS: GBS: NEGATIVE

## 2012-08-09 MED ORDER — IBUPROFEN 600 MG PO TABS: 600.0000 mg | ORAL_TABLET | Freq: Four times a day (QID) | ORAL | Status: DC | PRN

## 2012-08-09 MED ORDER — OXYCODONE-ACETAMINOPHEN 5-325 MG PO TABS
1.0000 | ORAL_TABLET | Freq: Once | ORAL | Status: AC
  Administered 2012-08-10: 2 via ORAL
  Filled 2012-08-09: qty 2

## 2012-08-09 NOTE — Progress Notes (Signed)
Patient ID: Savannah Cole, female   DOB: 03-01-1985, 27 y.o.   MRN: [id number] Savannah Cole is a 27 y.o. [id number] at [redacted]w[redacted]d by LMP admitted for pre-eclampsia  Subjective: Denies  Headache, visual changes, upper abdominal pain, nausea or vomiting.  GERD improved with Pepcid.  Continues to feel contractions as mild discomfort.  No vaginal discharge or fluid, no vaginal bleeding        Objective: BP 124/78  Pulse 105  Temp(Src) 98.7 F (37.1 C) (Oral)  Resp 18  Ht 5\' 5"  (1.651 m)  Wt 215 lb (97.523 kg)  BMI 35.78 kg/m2  SpO2 100%  LMP 12/04/2011 I/O last 3 completed shifts: In: 2717.9 [P.O.:1020; I.V.:1697.9] Out: 1500 [Urine:1500] Total I/O In: 250 [I.V.:250] Out: 0   FHT:  reassuring UC:   irregular SVE:   Dilation: Fingertip Effacement (%): Thick Exam by:: Haygood  Labs: Lab Results  Component Value Date   WBC 8.9 08/09/2012   HGB 12.5 08/09/2012   HCT 36.4 08/09/2012   MCV 86.7 08/09/2012   PLT 217 08/09/2012   Results for orders placed during the hospital encounter of 08/07/12 (from the past 24 hour(s))  CBC     Status: None   Collection Time    08/09/12  5:45 AM      Result Value Range   WBC 8.9  4.0 - 10.5 K/uL   RBC 4.20  3.87 - 5.11 MIL/uL   Hemoglobin 12.5  12.0 - 15.0 g/dL   HCT 14.7  82.9 - 56.2 %   MCV 86.7  78.0 - 100.0 fL   MCH 29.8  26.0 - 34.0 pg   MCHC 34.3  30.0 - 36.0 g/dL   RDW 13.0  86.5 - 78.4 %   Platelets 217  150 - 400 K/uL  COMPREHENSIVE METABOLIC PANEL     Status: Abnormal   Collection Time    08/09/12  5:45 AM      Result Value Range   Sodium 133 (*) 135 - 145 mEq/L   Potassium 3.9  3.5 - 5.1 mEq/L   Chloride 102  96 - 112 mEq/L   CO2 20  19 - 32 mEq/L   Glucose, Bld 118 (*) 70 - 99 mg/dL   BUN 9  6 - 23 mg/dL   Creatinine, Ser 6.96  0.50 - 1.10 mg/dL   Calcium 9.4  8.4 - 29.5 mg/dL   Total Protein 5.8 (*) 6.0 - 8.3 g/dL   Albumin 2.4 (*) 3.5 - 5.2 g/dL   AST 26  0 - 37 U/L   ALT 26  0 - 35 U/L   Alkaline Phosphatase  115  39 - 117 U/L   Total Bilirubin 0.2 (*) 0.3 - 1.2 mg/dL   GFR calc non Af Amer >90  >90 mL/min   GFR calc Af Amer >90  >90 mL/min    Assessment  IUP at [redacted]w[redacted]d Chronic hypertension with superimposed mild pre-eclampsia Prior c/s Desire for TOLAC for VBAC Unfavorable cervix Mild preterm contactions Mild hyponatremia  Plan: Reviewed options with pt and VBAC consent form discussed and signed Pt wants to continue observation of mild pre-eclampsia in hopes that this will increase her opportunity for TOLAC and VBAC. She verbalized understanding that the development of severe pre-eclampsia would require delivery Daily PIH labs to complete 72 hours. Saline lock IV. NST each shift    HAYGOOD,VANESSA P 08/09/2012, 11:42 AM

## 2012-08-09 NOTE — Progress Notes (Signed)
Notified Dr. Pennie Rushing of pt's c/o seeing spots before her eyes.  Pt. Described spots as little stars.  Denies having a HA, or feeling faint or light headed.  No c/o dizziness.  Orders given to retake Bp and notify Dr. Pennie Rushing if above Bp parameters.  Pt. Resting in bed on her left side.  HOB elevated x 30 degrees.

## 2012-08-09 NOTE — Progress Notes (Signed)
Lab tech here to draw blood for CBC and CMP.  Blood drawn and taken to lab..  Pt. Resting on her left side.  Will retake Bp.

## 2012-08-10 DIAGNOSIS — O34219 Maternal care for unspecified type scar from previous cesarean delivery: Secondary | ICD-10-CM | POA: Diagnosis present

## 2012-08-10 DIAGNOSIS — O119 Pre-existing hypertension with pre-eclampsia, unspecified trimester: Secondary | ICD-10-CM | POA: Diagnosis present

## 2012-08-10 LAB — URINALYSIS, DIPSTICK ONLY
Bilirubin Urine: NEGATIVE
Glucose, UA: NEGATIVE mg/dL
Ketones, ur: NEGATIVE mg/dL
Leukocytes, UA: NEGATIVE
Nitrite: NEGATIVE
Protein, ur: NEGATIVE mg/dL
Specific Gravity, Urine: 1.015 (ref 1.005–1.030)
Urobilinogen, UA: 0.2 mg/dL (ref 0.0–1.0)
pH: 6.5 (ref 5.0–8.0)

## 2012-08-10 LAB — CBC
Hemoglobin: 12.3 g/dL (ref 12.0–15.0)
MCH: 29.6 pg (ref 26.0–34.0)
MCHC: 34 g/dL (ref 30.0–36.0)
RDW: 14.1 % (ref 11.5–15.5)

## 2012-08-10 LAB — COMPREHENSIVE METABOLIC PANEL
ALT: 27 U/L (ref 0–35)
Albumin: 2.4 g/dL — ABNORMAL LOW (ref 3.5–5.2)
Calcium: 9.7 mg/dL (ref 8.4–10.5)
GFR calc Af Amer: >90 mL/min (ref >90)
Glucose, Bld: 110 mg/dL — ABNORMAL HIGH (ref 70–99)
Sodium: 133 mEq/L — ABNORMAL LOW (ref 135–145)
Total Protein: 5.4 g/dL — ABNORMAL LOW (ref 6.0–8.3)

## 2012-08-10 LAB — TYPE AND SCREEN: Antibody Screen: NEGATIVE

## 2012-08-10 LAB — AMNISURE RUPTURE OF MEMBRANE (ROM) NOT AT ARMC: Amnisure ROM: NEGATIVE

## 2012-08-10 MED ORDER — POLYETHYLENE GLYCOL 3350 17 G PO PACK
17.0000 g | PACK | Freq: Every day | ORAL | Status: DC | PRN
Start: 2012-08-10 — End: 2012-08-15
  Administered 2012-08-10: 17 g via ORAL
  Filled 2012-08-10 (×2): qty 1

## 2012-08-10 NOTE — Progress Notes (Addendum)
Patient ID: Savannah Cole, female   DOB: 1985/05/24, 27 y.o.   MRN: [id number] In to assess pt. Denies HA, SOB, visual changes.  States she has continuous lower back pain, worsened by contractions. Thinks BP may be up due to pain.  Denies VB.   150/98 Gen:  NAD, A&O x3 Abdomen:  No fundal tenderness Cervix: deferred Ext:  +3 edema, no calf tenderness, no TED or SCDs. Neuro: 2-3+  NST ~1600  Reactive,  Irregular contractions.  Labs from am wnl  A/p: CHTN with super imposed preeclampsia. Check urine dip for worsening proteinuria.  Monitor BP closely.  Proceed with IOL if BP consistently in severe range, 3+ proteinuria for 4 hours. Repeat labs in am or sooner for severe range BPs.  Proceed with IOL of worsening lab status. SCDs or TED hose to lower extremity. Plan d/w pt.

## 2012-08-10 NOTE — Progress Notes (Addendum)
Savannah Cole is a 27 y.o. [id number] at [redacted]w[redacted]d, HD#4 Diagnosed with Mild Preeclampsia by 24 hour protein 800+ mg.  Subjective: Pt reports mild discomfort with contraction this am.  Relived with Tylenol.  No headaches, visual changes.  Felt a gush of fluid earlier today but was unsure if she was peeing on herself.  Denies continued leakage.  No headaches.  Objective: BP 161/104  Pulse 110  Temp(Src) 98.5 F (36.9 C) (Oral)  Resp 18  Ht 5\' 5"  (1.651 m)  Wt 102.649 kg (226 lb 4.8 oz)  BMI 37.66 kg/m2  SpO2 100%  LMP 12/04/2011 I/O last 3 completed shifts: In: 5383.3 [P.O.:2950; I.V.:2433.3] Out: 4000 [Urine:4000] Total I/O In: 1440 [P.O.:1440] Out: 1600 [Urine:1600]  FHT:  Reactive UC:   irregular, every 3-5 minutes SVE:   Dilation: 1 Effacement (%): Thick Station: -3 Exam by:: Dr Dion Body Feel hair, unsure if membranes are intact  Labs: Lab Results  Component Value Date   WBC 9.5 08/10/2012   HGB 12.3 08/10/2012   HCT 36.2 08/10/2012   MCV 87.0 08/10/2012   PLT 211 08/10/2012   LFTs normal, Hg Platelets stable.  Assessment / Plan: CHTN with superimposed Preeclampsia at 36 0/7 weeks. Labs stable.  BPs mildly elevated.  Proceed with IOL for severe range BPs. H/o previous c-section.  Desires TOLAC.  Risk of uterine rupture previously discussed at length. Cervical change with mild contractions.  Recommend foley bulb with induction of labor. Discussed pt's wishes for transfusion.  Clearly states she would like PRBCs if necessary to sustain life. Continue bedrest with BRPs. Amniosure to r/o ROM.  Fetal Wellbeing:  Nolene Bernheim, Catcher Dehoyos 08/10/2012, 1:07 PM

## 2012-08-11 ENCOUNTER — Inpatient Hospital Stay (HOSPITAL_COMMUNITY): Payer: Commercial Managed Care - PPO

## 2012-08-11 LAB — TYPE AND SCREEN
DAT, IgG: NEGATIVE
Unit division: 0
Unit division: 0

## 2012-08-11 LAB — COMPREHENSIVE METABOLIC PANEL
AST: 30 U/L (ref 0–37)
Albumin: 2.6 g/dL — ABNORMAL LOW (ref 3.5–5.2)
Alkaline Phosphatase: 131 U/L — ABNORMAL HIGH (ref 39–117)
BUN: 9 mg/dL (ref 6–23)
Chloride: 104 mEq/L (ref 96–112)
Creatinine, Ser: 0.73 mg/dL (ref 0.50–1.10)
Potassium: 3.7 mEq/L (ref 3.5–5.1)
Total Protein: 6.6 g/dL (ref 6.0–8.3)

## 2012-08-11 LAB — CBC
HCT: 39.4 % (ref 36.0–46.0)
MCHC: 34.3 g/dL (ref 30.0–36.0)
Platelets: 218 10*3/uL (ref 150–400)
RDW: 14.3 % (ref 11.5–15.5)
WBC: 11.3 10*3/uL — ABNORMAL HIGH (ref 4.0–10.5)

## 2012-08-11 LAB — URIC ACID: Uric Acid, Serum: 6.3 mg/dL (ref 2.4–7.0)

## 2012-08-11 NOTE — Progress Notes (Signed)
Md aware of BP 

## 2012-08-11 NOTE — Progress Notes (Signed)
Subjective: HD #5 Patient reports difficulty laying on right side but denies RUQ pain when examined.  No headaches, visual changes, SOB.  Still with contractions and LBP.  Denies VB, LOF.    Objective: Vital signs in last 24 hours: Temp:  [98.5 F (36.9 C)-98.9 F (37.2 C)] 98.5 F (36.9 C) (07/21 2352) Pulse Rate:  [97-119] 102 (07/21 2352) Resp:  [18-20] 18 (07/22 0000) BP: (139-161)/(86-104) 139/86 mmHg (07/21 2352) Weight:  [102.649 kg (226 lb 4.8 oz)] 102.649 kg (226 lb 4.8 oz) (07/21 1022) 9 lb weight gain BP this am 155/99  Physical Exam:  General: alert, cooperative and no distress Cv:  RRR Lungs: CTA bilaterally Uterine Fundus: No fundal tenderness No RUQ pain DVT Evaluation: No evidence of DVT seen on physical exam. Calf/Ankle edema is present. Cervix 1/30/-2  Recent Labs  08/10/12 0545 08/11/12 0530  HGB 12.3 13.5  HCT 36.2 39.4  Platelets stable.  LFTs WNL Uric acid 6.3 Urine protein Neg overnight Cr. 70  Assessment/Plan: CHTN with superimposed preeclampsia Elevated BP-Serial BPs x 3.  Hemoconc but o/w not worsened.  Cr rising. BPP this am. Proceed with IOL with severe range BPs, worsening fetal status, abnormal labs studies. Plan d/w pt. Geryl Rankins 08/11/2012, 8:35 AM

## 2012-08-12 NOTE — Progress Notes (Signed)
Savannah Cole is a 27 y.o. [id number] at [redacted]w[redacted]d   Subjective: Pt without complaints.  Requests discharge.  After discussion, pt states it will likely be difficult to remain on bedrest due to her autistic, non verbal 22 year old daughter. Denies HA, visual changes, LOF, VB or RUQ pain.  Suprapubic pain with palpation only.  Fetal movement good.  Still having contractions, lower back pain, using K-pad.  Objective: BP 148/91  Pulse 99  Temp(Src) 98 F (36.7 C) (Oral)  Resp 18  Ht 5\' 5"  (1.651 m)  Wt 102.331 kg (225 lb 9.6 oz)  BMI 37.54 kg/m2  SpO2 100%  LMP 12/04/2011 I/O last 3 completed shifts: In: 3020 [P.O.:3020] Out: 3650 [Urine:3650]   Gen:  NAD, A&O x 3, no acute distress, comfortable CV:  RRR Lungs: CTA bilaterally Abdomen:  No fundal tenderness, suprapubic tenderness to palpation. Ext:  TED hose on.  No calf tenderness Neuro: 2-3+, no clonus  FHT:  Reactive tracing overnight, variability improved from previous, no decels UC:   irregular SVE:   Dilation: 1 Effacement (%): 50 Station: -2 Exam by:: E. Varnadoi  Labs: Lab Results  Component Value Date   WBC 11.3* 08/11/2012   HGB 13.5 08/11/2012   HCT 39.4 08/11/2012   MCV 87.4 08/11/2012   PLT 218 08/11/2012   AFI=15 cm.  Assessment / Plan: IUP at 36 2/7 weeks CHTN with superimposed preeclampsia, mild. BPs remain mild without medication. Continue observation.  Deliver for severe range BPs. BPP twice weekly, next due Friday7/25.  Order put in. Cont NST every 8 hours.  H/o VBAC, desires TOLAC.  Cervix is slowly effacing.  Good candidate for foley bulb induction. Plan to induce at 37 weeks or proceed with IOL with severe range BPs, worsening fetal status, abnormal labs studies.  Continue inpatient bedrest.  I do not feel that pt will be compliant with bedrest at home and pt agrees.  Will change room with more natural light and better view. LUS pain with palpation.  D/w Dr. Vincenza Hews, MFM.  States that is not  indicative of uterine rupture or increased risk of uterine rupture.  Ultrasound is not very reliable to predict rupture.  Agrees with TOLAC at 37 weeks via foley bulb.  Plan and MFM discussed reviewed with patient.  Geryl Rankins 08/12/2012, 10:26 AM

## 2012-08-13 NOTE — Progress Notes (Signed)
Savannah Cole is a 27 y.o. [id number] at [redacted]w[redacted]d by  admitted for mild preeclampsia   Subjective:  Irregular contractions +FM no lof no vaginal bleeding. Denies headache blurred vision ruq pain.   Objective: BP 157/104  Pulse 106  Temp(Src) 98.3 F (36.8 C) (Oral)  Resp 18  Ht 5\' 5"  (1.651 m)  Wt 103.647 kg (228 lb 8 oz)  BMI 38.02 kg/m2  SpO2 100%  LMP 12/04/2011 I/O last 3 completed shifts: In: 1340 [P.O.:1340] Out: 1550 [Urine:1550] Total I/O In: 900 [P.O.:900] Out: 1050 [Urine:1050]  FHT:  Baseline 140 Accelerations are present no decelerations. Tracing is reactive  UC:   irregular, every 2-10  minutes SVE:   Dilation: 1 Effacement (%): 50 Station: -2 Exam by:: E. Varnado  Labs: Lab Results  Component Value Date   WBC 11.3* 08/11/2012   HGB 13.5 08/11/2012   HCT 39.4 08/11/2012   MCV 87.4 08/11/2012   PLT 218 08/11/2012    Assessment  36 wks and 3 days with chronic hypertension and superimposed mild preeclampsia.  H/O cesarean section    Plan  Continue inpatient bedrest.  Monitor for signs and symptoms of severe preeclampsia. Plan to move toward delivery  Via TOL at 37 wks  If pt remains mild preeclamptic   IOL  Sooner  in the event of symptoms or signs indicating severe preeclampsia or nonreassuring fetal status.  Continue NST and BPP  Dr. Dion Body on 7/25 Arty Lantzy J. 08/13/2012, 4:01 PM

## 2012-08-14 ENCOUNTER — Ambulatory Visit (HOSPITAL_COMMUNITY): Payer: Commercial Managed Care - PPO

## 2012-08-14 LAB — COMPREHENSIVE METABOLIC PANEL
ALT: 28 U/L (ref 0–35)
CO2: 21 mEq/L (ref 19–32)
Calcium: 9.8 mg/dL (ref 8.4–10.5)
Creatinine, Ser: 0.7 mg/dL (ref 0.50–1.10)
GFR calc Af Amer: >90 mL/min (ref >90)
GFR calc non Af Amer: >90 mL/min (ref >90)
Glucose, Bld: 97 mg/dL (ref 70–99)
Sodium: 135 mEq/L (ref 135–145)

## 2012-08-14 LAB — CBC
Hemoglobin: 12.7 g/dL (ref 12.0–15.0)
MCH: 30.1 pg (ref 26.0–34.0)
MCV: 86.7 fL (ref 78.0–100.0)
RBC: 4.22 MIL/uL (ref 3.87–5.11)

## 2012-08-14 NOTE — Progress Notes (Signed)
Antenatal Nutrition Assessment:  Currently  36 4/[redacted] weeks gestation, with mild preeclampsia. Height  65" Weight 228 Lbs pre-pregnancy weight 170 Lbs.Pre-pregnancy  BMI 28.3  IBW 125 lbs  Total weight gain 58 lbs. Weight gain goals 15-25 lbs.   Estimated needs: 19-2100 kcal/day, 65-75 grams protein/day, 2.2 liters fluid/day  regular diet tolerated well, appetite good.Diet order changed to antenatal regular to allow snacks TID Current diet prescription will provide for increased needs.  No abnormal nutrition related labs  Nutrition Dx: Increased nutrient needs r/t pregnancy and fetal growth requirements aeb [redacted] weeks gestation.  No educational needs assessed at this time.  Elisabeth Cara M.Odis Luster LDN Neonatal Nutrition Support Specialist Pager [phone]

## 2012-08-14 NOTE — Progress Notes (Signed)
Savannah Cole is a 27 y.o. [id number] at [redacted]w[redacted]d   Subjective: Pt without complaints.  Denies HA, visual changes, LOF, VB or RUQ pain.  Previously had suprapubic pain with palpation only but none today.  Still having contractions, stronger this am.  Tylenol helped.  Objective:  T98.2  164/102  HR 102 Gen:  NAD, A&O x 3, no acute distress, comfortable, resting on her right side.  BP was taken on pt lying on rt side. Lungs: CTA bilaterally Abdomen:  No fundal tenderness, suprapubic tenderness to palpation. Ext:  TED hose on.  No calf tenderness Neuro: 2-3+, no clonus  FHT:  Reactive tracing, variability improved from previous, no decels UC:   Irregular, as close as q 2 min, irritability SVE:   Dilation: 1 Effacement (%): 50 Station: -2 Exam by:: E. Varnadoi Cervix deferred today.  Labs: Lab Results  Component Value Date   WBC 9.4 08/14/2012   HGB 12.7 08/14/2012   HCT 36.6 08/14/2012   MCV 86.7 08/14/2012   PLT 202 08/14/2012  7/25 BPP 8/8 AFI=11 cm. Labs stable Urine dip pending.  Assessment / Plan: IUP at 36 4/7 weeks CHTN with superimposed preeclampsia, mild.  BP elevated in severe range currently. Seriel BPs q 15 min x 3.  If persistently severe, proceed with IOL tonight via foley bulb. Cont NST every 8 hours.  H/o VBAC, desires TOLAC.  Cervix is slowly effacing.  Good candidate for foley bulb induction. Plan to induce at 37 weeks or proceed with IOL with severe range BPs, worsening fetal status, abnormal labs studies.  Continue inpatient bedrest.  I do not feel that pt will be compliant with bedrest at home and pt agrees.   Pt's mood improved with room change, more natural light. F/u urine dip. Plan  discussed reviewed with patient.  Geryl Rankins 08/14/2012, 6:45 PM

## 2012-08-15 ENCOUNTER — Encounter (HOSPITAL_COMMUNITY): Payer: Self-pay | Admitting: Anesthesiology

## 2012-08-15 ENCOUNTER — Inpatient Hospital Stay (HOSPITAL_COMMUNITY): Payer: Commercial Managed Care - PPO | Admitting: Anesthesiology

## 2012-08-15 LAB — CBC
HCT: 37 % (ref 36.0–46.0)
Hemoglobin: 12.6 g/dL (ref 12.0–15.0)
MCHC: 34.1 g/dL (ref 30.0–36.0)
Platelets: 212 10*3/uL (ref 150–400)
RBC: 4.07 MIL/uL (ref 3.87–5.11)
RBC: 4.24 MIL/uL (ref 3.87–5.11)
RDW: 14 % (ref 11.5–15.5)
WBC: 8.4 10*3/uL (ref 4.0–10.5)

## 2012-08-15 LAB — COMPREHENSIVE METABOLIC PANEL
AST: 30 U/L (ref 0–37)
Albumin: 2.4 g/dL — ABNORMAL LOW (ref 3.5–5.2)
Alkaline Phosphatase: 123 U/L — ABNORMAL HIGH (ref 39–117)
Chloride: 101 mEq/L (ref 96–112)
Creatinine, Ser: 0.64 mg/dL (ref 0.50–1.10)
Potassium: 3.7 mEq/L (ref 3.5–5.1)
Total Bilirubin: 0.2 mg/dL — ABNORMAL LOW (ref 0.3–1.2)
Total Protein: 6.1 g/dL (ref 6.0–8.3)

## 2012-08-15 LAB — RPR: RPR Ser Ql: NONREACTIVE

## 2012-08-15 LAB — URIC ACID: Uric Acid, Serum: 7.5 mg/dL — ABNORMAL HIGH (ref 2.4–7.0)

## 2012-08-15 LAB — MAGNESIUM: Magnesium: 3.6 mg/dL — ABNORMAL HIGH (ref 1.5–2.5)

## 2012-08-15 LAB — LACTATE DEHYDROGENASE: LDH: 229 U/L (ref 94–250)

## 2012-08-15 MED ORDER — EPHEDRINE 5 MG/ML INJ
10.0000 mg | INTRAVENOUS | Status: DC | PRN
  Filled 2012-08-15: qty 2

## 2012-08-15 MED ORDER — PHENYLEPHRINE 40 MCG/ML (10ML) SYRINGE FOR IV PUSH (FOR BLOOD PRESSURE SUPPORT)
80.0000 ug | PREFILLED_SYRINGE | INTRAVENOUS | Status: DC | PRN
  Administered 2012-08-15 (×2): 80 ug via INTRAVENOUS
  Filled 2012-08-15: qty 2

## 2012-08-15 MED ORDER — MAGNESIUM SULFATE BOLUS VIA INFUSION
4.0000 g | Freq: Once | INTRAVENOUS | Status: AC
  Administered 2012-08-15: 4 g via INTRAVENOUS
  Filled 2012-08-15: qty 500

## 2012-08-15 MED ORDER — OXYTOCIN 40 UNITS IN LACTATED RINGERS INFUSION - SIMPLE MED
1.0000 m[IU]/min | INTRAVENOUS | Status: DC
  Administered 2012-08-15: 1 m[IU]/min via INTRAVENOUS

## 2012-08-15 MED ORDER — DIPHENHYDRAMINE HCL 50 MG/ML IJ SOLN: 12.5000 mg | INTRAMUSCULAR | Status: DC | PRN

## 2012-08-15 MED ORDER — ONDANSETRON HCL 4 MG/2ML IJ SOLN
4.0000 mg | Freq: Four times a day (QID) | INTRAMUSCULAR | Status: DC | PRN
  Filled 2012-08-15: qty 2

## 2012-08-15 MED ORDER — PHENYLEPHRINE 40 MCG/ML (10ML) SYRINGE FOR IV PUSH (FOR BLOOD PRESSURE SUPPORT)
80.0000 ug | PREFILLED_SYRINGE | INTRAVENOUS | Status: DC | PRN
  Filled 2012-08-15: qty 2
  Filled 2012-08-15: qty 5

## 2012-08-15 MED ORDER — IBUPROFEN 600 MG PO TABS
600.0000 mg | ORAL_TABLET | Freq: Four times a day (QID) | ORAL | Status: DC | PRN
  Administered 2012-08-16 (×2): 600 mg via ORAL
  Filled 2012-08-15 (×2): qty 1

## 2012-08-15 MED ORDER — LACTATED RINGERS IV SOLN
INTRAVENOUS | Status: DC
  Administered 2012-08-15: 21:00:00 via INTRAVENOUS

## 2012-08-15 MED ORDER — BUTORPHANOL TARTRATE 1 MG/ML IJ SOLN
1.0000 mg | INTRAMUSCULAR | Status: DC | PRN
  Administered 2012-08-15 (×3): 1 mg via INTRAVENOUS
  Filled 2012-08-15 (×3): qty 1

## 2012-08-15 MED ORDER — LACTATED RINGERS IV SOLN
500.0000 mL | Freq: Once | INTRAVENOUS | Status: AC
  Administered 2012-08-15: 500 mL via INTRAVENOUS

## 2012-08-15 MED ORDER — BUTORPHANOL TARTRATE 1 MG/ML IJ SOLN
1.0000 mg | Freq: Once | INTRAMUSCULAR | Status: AC
  Administered 2012-08-15: 1 mg via INTRAVENOUS
  Filled 2012-08-15: qty 1

## 2012-08-15 MED ORDER — OXYTOCIN BOLUS FROM INFUSION
500.0000 mL | INTRAVENOUS | Status: DC
  Administered 2012-08-16: 500 mL via INTRAVENOUS

## 2012-08-15 MED ORDER — CITRIC ACID-SODIUM CITRATE 334-500 MG/5ML PO SOLN: 30.0000 mL | ORAL | Status: DC | PRN

## 2012-08-15 MED ORDER — TERBUTALINE SULFATE 1 MG/ML IJ SOLN: 0.2500 mg | Freq: Once | INTRAMUSCULAR | Status: AC | PRN

## 2012-08-15 MED ORDER — OXYCODONE-ACETAMINOPHEN 5-325 MG PO TABS
1.0000 | ORAL_TABLET | ORAL | Status: DC | PRN
  Administered 2012-08-16 (×2): 1 via ORAL
  Filled 2012-08-15 (×3): qty 1

## 2012-08-15 MED ORDER — MAGNESIUM SULFATE 40 G IN LACTATED RINGERS - SIMPLE
2.0000 g/h | INTRAVENOUS | Status: DC
  Administered 2012-08-15 (×2): 2 g/h via INTRAVENOUS
  Filled 2012-08-15: qty 500

## 2012-08-15 MED ORDER — LIDOCAINE HCL (PF) 1 % IJ SOLN
INTRAMUSCULAR | Status: DC | PRN
  Administered 2012-08-15: 10 mL

## 2012-08-15 MED ORDER — OXYTOCIN 40 UNITS IN LACTATED RINGERS INFUSION - SIMPLE MED
62.5000 mL/h | INTRAVENOUS | Status: DC
  Administered 2012-08-16: 62.5 mL/h via INTRAVENOUS
  Filled 2012-08-15: qty 1000

## 2012-08-15 MED ORDER — LACTATED RINGERS IV SOLN: 500.0000 mL | INTRAVENOUS | Status: DC | PRN

## 2012-08-15 MED ORDER — LIDOCAINE HCL (PF) 1 % IJ SOLN
30.0000 mL | INTRAMUSCULAR | Status: DC | PRN
  Filled 2012-08-15 (×2): qty 30

## 2012-08-15 MED ORDER — ACETAMINOPHEN 325 MG PO TABS: 650.0000 mg | ORAL_TABLET | ORAL | Status: DC | PRN

## 2012-08-15 MED ORDER — FENTANYL 2.5 MCG/ML BUPIVACAINE 1/10 % EPIDURAL INFUSION (WH - ANES)
14.0000 mL/h | INTRAMUSCULAR | Status: DC | PRN
  Administered 2012-08-15: 14 mL/h via EPIDURAL
  Filled 2012-08-15: qty 125

## 2012-08-15 MED ORDER — HYDRALAZINE HCL 20 MG/ML IJ SOLN
5.0000 mg | INTRAMUSCULAR | Status: DC | PRN
  Administered 2012-08-16: 5 mg via INTRAVENOUS
  Filled 2012-08-15: qty 1

## 2012-08-15 MED ORDER — EPHEDRINE 5 MG/ML INJ
10.0000 mg | INTRAVENOUS | Status: DC | PRN
  Filled 2012-08-15: qty 4
  Filled 2012-08-15: qty 2

## 2012-08-15 NOTE — Progress Notes (Signed)
Lab tech here to draw blood for CBC.

## 2012-08-15 NOTE — Anesthesia Preprocedure Evaluation (Signed)
Anesthesia Evaluation  Patient identified by MRN, date of birth, ID band Patient awake    Reviewed: Allergy & Precautions, H&P , Patient's Chart, lab work & pertinent test results  Airway Mallampati: II TM Distance: >3 FB Neck ROM: full    Dental no notable dental hx.    Pulmonary neg pulmonary ROS,  breath sounds clear to auscultation  Pulmonary exam normal       Cardiovascular hypertension, negative cardio ROS  Rhythm:regular Rate:Normal     Neuro/Psych PSYCHIATRIC DISORDERS Anxiety negative neurological ROS  negative psych ROS   GI/Hepatic negative GI ROS, Neg liver ROS,   Endo/Other  negative endocrine ROSMorbid obesity  Renal/GU negative Renal ROS     Musculoskeletal   Abdominal   Peds  Hematology negative hematology ROS (+)   Anesthesia Other Findings Anxiety     Gonorrhea        Chlamydia     Hypertension        Preeclampsia   With first pregnancy Gestational diabetes   With first pregnancy    Reproductive/Obstetrics (+) Pregnancy                           Anesthesia Physical Anesthesia Plan  ASA: III  Anesthesia Plan: Epidural   Post-op Pain Management:    Induction:   Airway Management Planned:   Additional Equipment:   Intra-op Plan:   Post-operative Plan:   Informed Consent: I have reviewed the patients History and Physical, chart, labs and discussed the procedure including the risks, benefits and alternatives for the proposed anesthesia with the patient or authorized representative who has indicated his/her understanding and acceptance.     Plan Discussed with:   Anesthesia Plan Comments:         Anesthesia Quick Evaluation

## 2012-08-15 NOTE — Progress Notes (Signed)
Savannah Cole is a 27 y.o. [id number] at [redacted]w[redacted]d   Subjective: Pt comfortable.  After AROM, pt desires epidural.  Denies headaches visual changes.  Objective: BP 153/95  Pulse 104  Temp(Src) 97.8 F (36.6 C) (Oral)  Resp 20  Ht 5\' 5"  (1.651 m)  Wt 103.511 kg (228 lb 3.2 oz)  BMI 37.97 kg/m2  SpO2 100%  LMP 12/04/2011 I/O last 3 completed shifts: In: 2947.9 [P.O.:2730; I.V.:217.9] Out: 3050 [Urine:3050] Total I/O In: 150 [I.V.:150] Out: 300 [Emesis/NG output:300] 20 cc in hat currently.  FHT:  Reactive UC:   q1-2 min SVE:   Dilation: 3.5 Effacement (%): 100 Station: -1 Exam by:: C. Blackstock, RN  AROM clear, IUPC placed without difficulty Labs: Lab Results  Component Value Date   WBC 8.4 08/15/2012   HGB 12.0 08/15/2012   HCT 35.2* 08/15/2012   MCV 86.5 08/15/2012   PLT 212 08/15/2012    Assessment / Plan: Spontaneous labor, progressing normally Continue MgSO4 CBC now for epidural.  Magnesium level also. No signs or symptoms of uterine rupture, magnesium toxicity. Hydralazine IV prn BP165/105 x 2.  Labor: Progressing normally Preeclampsia:  on magnesium sulfate Fetal Wellbeing:  Category I Pain Control:  Epidural I/D:  N/a  Anticipated MOD:  VBAC  Savannah Cole 08/15/2012, 8:48 PM

## 2012-08-15 NOTE — Progress Notes (Signed)
Patient ID: Savannah Cole, female   DOB: 1985-03-07, 27 y.o.   MRN: [id number]  Notified by RN regarding abdominal pain, contractions.  Pt would not stay on monitor but tracing was reassuring.  2 cm by RN's exam previously 1/50/high.  Upon arrival to room, pt is pacing and crying.  Ambulating without difficulty but appearance of labor.  Reports lower abdominal pain, urge to urinate but can not.  Per RN pt only voided 100 cc.  Denies vaginal bleeding, active fetus.  Desires to VBAC.  Declines epidural at this time.  Would like IV pain medication.  157/91 HR 103  Gen:  As above Abdomen:  No reproducible pain Cervix: 2/100/-3, head above symphysis pubis, no bleeding/bloody show.  NST reviewed.  Reactive, good variability, irritability, irregular contractions.  A/P: IUP at 36 5/7 weeks, CHTN with superimposed Preeclampsia, mild.  Active labor, H/o previous c-section, desires TOL.   VBAC risks reviewed with pt.  Desires to proceed. Stadol IV now. Continuous fetal monitoring.  Informed pt on importance of fetal monitoring to help diagnosis uterine rupture. To L&D. Spoke with Consulting civil engineer.  Unable to transfer at this time due to staffing.  If progressing rapidly she will expedite transfer. Magnesium sulfate for seizure prophylaxis. GBS negative. Plan to AROM and place IUPC once in L&D.

## 2012-08-15 NOTE — Progress Notes (Signed)
NST reactive no decels. Pt with increasing discomfort.  Desires Stadol. Gen:  No acute distress Cervix: 3/100/-3, no bleeding. Continuous monitoring. Awaiting transfer to L&D.  Charge RN notified of current status.

## 2012-08-15 NOTE — Progress Notes (Signed)
Notified Dr. Dion Body of pt's c/o low abd. Pain and discomfort.  Pt. Reports not being able to lay in bed.  Cervix check performed.  1cm/50/0.

## 2012-08-15 NOTE — Progress Notes (Addendum)
SHANDEL BUSIC is a 27 y.o. [id number] at [redacted]w[redacted]d   Subjective: Pt more comfortable s/p epidural.  Denies HA, visual changes.  Objective: BP 120/52  Pulse 97  Temp(Src) 98.2 F (36.8 C) (Oral)  Resp 16  Ht 5\' 5"  (1.651 m)  Wt 103.511 kg (228 lb 3.2 oz)  BMI 37.97 kg/m2  SpO2 99%  LMP 12/04/2011 I/O last 3 completed shifts: In: 2947.9 [P.O.:2730; I.V.:217.9] Out: 3050 [Urine:3050] Total I/O In: 330 [P.O.:30; I.V.:300] Out: 320 [Urine:20; Emesis/NG output:300]  FHT:  Accels, decreased variability.  Prolonged decel with hypotension.  Recovered with accels UC:   Dysfunctional pattern, q1 min x 5 contractions then none for ~10 minutes SVE:   Dilation: 7.5 Effacement (%): 100 Station: 0 Exam by:: C. Blackstock, RN Cervix rechecked to assess IUPC.  Molding noted. Slight edema of cervix.  IUPC correctly positioned. Labs: Lab Results  Component Value Date   WBC 10.3 08/15/2012   HGB 12.6 08/15/2012   HCT 37.0 08/15/2012   MCV 87.3 08/15/2012   PLT 202 08/15/2012    Assessment / Plan: Spontaneous labor, progressing normally Recommend augmentation.  Start 1 milliunit and increase by 1 milliunit Labor: Progressed Preeclampsia:  on magnesium sulfate Fetal Wellbeing:  Category I Pain Control:  Epidural I/D:  n/a Anticipated MOD:  NSVD  Danyell Shader 08/15/2012, 11:03 PM

## 2012-08-15 NOTE — Progress Notes (Signed)
In to assess pt. S/p Stadol and Magnesium bolus. Pt states she is more comfortable.  Still with lower abdominal pressure.  No bleeding. ~149/89 Gen:  NAD, A&O x 3 CV:  RRR Lungs:  CTA bilaterally Abdomin:  Non tender Cervix:  Deferred External monitoring:  Accels, variability decreased s/p Magnesium sulfate bolus, contractions q 1-6 min  A/P Active labor, Desires TOLAC-no signs of uterine rupture. CHTN, superimposed preeclampsia, mild on Magnesium sulfate Awaiting transfer to L&D.

## 2012-08-15 NOTE — Anesthesia Procedure Notes (Signed)
Epidural Patient location during procedure: OB Start time: 08/15/2012 9:47 PM  Staffing Anesthesiologist: Angus Seller., Harrell Gave. Performed by: anesthesiologist   Preanesthetic Checklist Completed: patient identified, site marked, surgical consent, pre-op evaluation, timeout performed, IV checked, risks and benefits discussed and monitors and equipment checked  Epidural Patient position: sitting Prep: site prepped and draped and DuraPrep Patient monitoring: continuous pulse ox and blood pressure Approach: midline Injection technique: LOR air and LOR saline  Needle:  Needle type: Tuohy  Needle gauge: 17 G Needle length: 9 cm and 9 Needle insertion depth: 5 cm cm Catheter type: closed end flexible Catheter size: 19 Gauge Catheter at skin depth: 10 cm Test dose: negative  Assessment Events: blood not aspirated, injection not painful, no injection resistance, negative IV test and no paresthesia  Additional Notes Patient identified.  Risk benefits discussed including failed block, incomplete pain control, headache, nerve damage, paralysis, blood pressure changes, nausea, vomiting, reactions to medication both toxic or allergic, and postpartum back pain.  Patient expressed understanding and wished to proceed.  All questions were answered.  Sterile technique used throughout procedure and epidural site dressed with sterile barrier dressing. No paresthesia or other complications noted.The patient did not experience any signs of intravascular injection such as tinnitus or metallic taste in mouth nor signs of intrathecal spread such as rapid motor block. Please see nursing notes for vital signs.

## 2012-08-16 ENCOUNTER — Encounter (HOSPITAL_COMMUNITY): Payer: Self-pay | Admitting: *Deleted

## 2012-08-16 LAB — COMPREHENSIVE METABOLIC PANEL
ALT: 28 U/L (ref 0–35)
ALT: 33 U/L (ref 0–35)
ALT: 33 U/L (ref 0–35)
AST: 29 U/L (ref 0–37)
AST: 40 U/L — ABNORMAL HIGH (ref 0–37)
AST: 44 U/L — ABNORMAL HIGH (ref 0–37)
Albumin: 2.3 g/dL — ABNORMAL LOW (ref 3.5–5.2)
Albumin: 2.1 g/dL — ABNORMAL LOW (ref 3.5–5.2)
Alkaline Phosphatase: 116 U/L (ref 39–117)
CO2: 22 mEq/L (ref 19–32)
CO2: 20 mEq/L (ref 19–32)
CO2: 24 mEq/L (ref 19–32)
Calcium: 9.5 mg/dL (ref 8.4–10.5)
Chloride: 99 mEq/L (ref 96–112)
Chloride: 101 mEq/L (ref 96–112)
Chloride: 103 mEq/L (ref 96–112)
Creatinine, Ser: 1.02 mg/dL (ref 0.50–1.10)
Creatinine, Ser: 1.35 mg/dL — ABNORMAL HIGH (ref 0.50–1.10)
GFR calc Af Amer: 61 mL/min — ABNORMAL LOW (ref >90)
GFR calc non Af Amer: 74 mL/min — ABNORMAL LOW (ref >90)
GFR calc non Af Amer: 52 mL/min — ABNORMAL LOW (ref >90)
GFR calc non Af Amer: >90 mL/min (ref >90)
Glucose, Bld: 107 mg/dL — ABNORMAL HIGH (ref 70–99)
Potassium: 3.9 mEq/L (ref 3.5–5.1)
Sodium: 134 mEq/L — ABNORMAL LOW (ref 135–145)
Sodium: 136 mEq/L (ref 135–145)
Sodium: 137 mEq/L (ref 135–145)
Total Bilirubin: 0.2 mg/dL — ABNORMAL LOW (ref 0.3–1.2)
Total Bilirubin: 0.3 mg/dL (ref 0.3–1.2)

## 2012-08-16 LAB — CBC
HCT: 34.6 % — ABNORMAL LOW (ref 36.0–46.0)
Hemoglobin: 11.9 g/dL — ABNORMAL LOW (ref 12.0–15.0)
MCH: 29.8 pg (ref 26.0–34.0)
MCHC: 34.4 g/dL (ref 30.0–36.0)
MCV: 87.4 fL (ref 78.0–100.0)
Platelets: 204 10*3/uL (ref 150–400)
Platelets: 215 10*3/uL (ref 150–400)
RBC: 3.82 MIL/uL — ABNORMAL LOW (ref 3.87–5.11)
RBC: 3.99 MIL/uL (ref 3.87–5.11)
RBC: 3.93 MIL/uL (ref 3.87–5.11)
RDW: 14.2 % (ref 11.5–15.5)
RDW: 14.3 % (ref 11.5–15.5)
WBC: 9.6 10*3/uL (ref 4.0–10.5)
WBC: 12.2 10*3/uL — ABNORMAL HIGH (ref 4.0–10.5)

## 2012-08-16 LAB — URIC ACID: Uric Acid, Serum: 7.8 mg/dL — ABNORMAL HIGH (ref 2.4–7.0)

## 2012-08-16 LAB — MAGNESIUM: Magnesium: 4.1 mg/dL — ABNORMAL HIGH (ref 1.5–2.5)

## 2012-08-16 MED ORDER — MAGNESIUM SULFATE 40 G IN LACTATED RINGERS - SIMPLE
2.0000 g/h | INTRAVENOUS | Status: DC
  Administered 2012-08-16: 2 g/h via INTRAVENOUS
  Filled 2012-08-16: qty 500

## 2012-08-16 MED ORDER — ONDANSETRON HCL 4 MG PO TABS: 4.0000 mg | ORAL_TABLET | ORAL | Status: DC | PRN

## 2012-08-16 MED ORDER — ZOLPIDEM TARTRATE 5 MG PO TABS: 5.0000 mg | ORAL_TABLET | Freq: Every evening | ORAL | Status: DC | PRN

## 2012-08-16 MED ORDER — BENZOCAINE-MENTHOL 20-0.5 % EX AERO: 1.0000 "application " | INHALATION_SPRAY | CUTANEOUS | Status: DC | PRN

## 2012-08-16 MED ORDER — DIPHENHYDRAMINE HCL 25 MG PO CAPS: 25.0000 mg | ORAL_CAPSULE | Freq: Four times a day (QID) | ORAL | Status: DC | PRN

## 2012-08-16 MED ORDER — LACTATED RINGERS IV SOLN
INTRAVENOUS | Status: DC
  Administered 2012-08-16 – 2012-08-17 (×3): via INTRAVENOUS

## 2012-08-16 MED ORDER — IBUPROFEN 600 MG PO TABS
600.0000 mg | ORAL_TABLET | Freq: Four times a day (QID) | ORAL | Status: DC
  Administered 2012-08-16 – 2012-08-18 (×6): 600 mg via ORAL
  Filled 2012-08-16 (×6): qty 1

## 2012-08-16 MED ORDER — SIMETHICONE 80 MG PO CHEW
80.0000 mg | CHEWABLE_TABLET | ORAL | Status: DC | PRN
  Administered 2012-08-18: 80 mg via ORAL

## 2012-08-16 MED ORDER — LANOLIN HYDROUS EX OINT: TOPICAL_OINTMENT | CUTANEOUS | Status: DC | PRN

## 2012-08-16 MED ORDER — LABETALOL HCL 100 MG PO TABS
100.0000 mg | ORAL_TABLET | Freq: Two times a day (BID) | ORAL | Status: DC
  Administered 2012-08-16 – 2012-08-17 (×3): 100 mg via ORAL
  Filled 2012-08-16 (×5): qty 1

## 2012-08-16 MED ORDER — OXYCODONE-ACETAMINOPHEN 5-325 MG PO TABS
1.0000 | ORAL_TABLET | ORAL | Status: DC | PRN
  Administered 2012-08-16: 2 via ORAL
  Administered 2012-08-17 (×3): 1 via ORAL
  Filled 2012-08-16: qty 2
  Filled 2012-08-16 (×3): qty 1

## 2012-08-16 MED ORDER — TETANUS-DIPHTH-ACELL PERTUSSIS 5-2.5-18.5 LF-MCG/0.5 IM SUSP
0.5000 mL | Freq: Once | INTRAMUSCULAR | Status: AC
Start: 2012-08-17 — End: 2012-08-17
  Administered 2012-08-17: 0.5 mL via INTRAMUSCULAR
  Filled 2012-08-16: qty 0.5

## 2012-08-16 MED ORDER — OXYTOCIN 40 UNITS IN LACTATED RINGERS INFUSION - SIMPLE MED: 1.0000 m[IU]/min | INTRAVENOUS | Status: DC

## 2012-08-16 MED ORDER — MAGNESIUM HYDROXIDE 400 MG/5ML PO SUSP
30.0000 mL | ORAL | Status: DC | PRN
  Filled 2012-08-16: qty 30

## 2012-08-16 MED ORDER — SENNOSIDES-DOCUSATE SODIUM 8.6-50 MG PO TABS
2.0000 | ORAL_TABLET | Freq: Every day | ORAL | Status: DC
  Administered 2012-08-16 – 2012-08-17 (×2): 2 via ORAL

## 2012-08-16 MED ORDER — MEASLES, MUMPS & RUBELLA VAC ~~LOC~~ INJ: 0.5000 mL | INJECTION | Freq: Once | SUBCUTANEOUS | Status: DC

## 2012-08-16 MED ORDER — PRENATAL MULTIVITAMIN CH
1.0000 | ORAL_TABLET | Freq: Every day | ORAL | Status: DC
  Administered 2012-08-17: 1 via ORAL
  Filled 2012-08-16: qty 1

## 2012-08-16 MED ORDER — WITCH HAZEL-GLYCERIN EX PADS: 1.0000 "application " | MEDICATED_PAD | CUTANEOUS | Status: DC | PRN

## 2012-08-16 MED ORDER — OXYTOCIN 40 UNITS IN LACTATED RINGERS INFUSION - SIMPLE MED: 62.5000 mL/h | INTRAVENOUS | Status: DC | PRN

## 2012-08-16 MED ORDER — ONDANSETRON HCL 4 MG/2ML IJ SOLN: 4.0000 mg | INTRAMUSCULAR | Status: DC | PRN

## 2012-08-16 MED ORDER — DIBUCAINE 1 % RE OINT: 1.0000 "application " | TOPICAL_OINTMENT | RECTAL | Status: DC | PRN

## 2012-08-16 NOTE — Progress Notes (Addendum)
Post Partum Day 0   S/p Hydralazine IV x 2 doses.  Subjective: Complaints of lower extremity heaviness.  Had a headache previously that resolved with Percocet.  Denies SOB.  No visual changes currently.  Objective: Blood pressure 141/61, pulse 121, temperature 98.1 F (36.7 C), temperature source Oral, resp. rate 20, height 5\' 5"  (1.651 m), weight 103.511 kg (228 lb 3.2 oz), last menstrual period 12/04/2011, SpO2 99.00%, unknown if currently breastfeeding. UOP 325/300/300/ 75  Physical Exam:  General: alert, cooperative and no distress, Speech normal Lochia: appropriate Uterine Fundus: firm Incision: n/a DVT Evaluation: No evidence of DVT seen on physical exam. Edema still present up to mid thigh but resolving, skin softer. Neuro:3+  Recent Labs  08/16/12 0118 08/16/12 0600  HGB 11.2* 11.9*  HCT 33.4* 34.6*  Cr. Increased to 1.35 Mg level 4.1 after delivery and resumed.  Assessment/Plan: S/p VBAC, CHTN with superimposed Preeclampsia, severe by BP, renal function. Currently stable.  No signs of Magnesium toxicity. Labetalol 100 mg BID started to stabalize BP. Monitor UOP closely.  Discontinue Magnesium 24 hours after delivery. Repeat lab studies for 1800.    Addendum:  Pt is tachycardic and has been since antepartum admission.  Check TSH with 1800 labs.  Labetalol should help reduce heart rate.   LOS: 9 days   Savannah Cole 08/16/2012, 2:29 PM

## 2012-08-16 NOTE — Progress Notes (Signed)
08/16/12 1200  Vitals  Temp 98.1 F (36.7 C)  Temp src Oral  BP ! 161/90 mmHg  MAP (mmHg) 103  BP Location Right arm  BP Method Automatic  Patient Position, if appropriate Lying  Pulse Rate ! 123  Resp 20  Oxygen Therapy  SpO2 100 %  O2 Device None (Room air)  Follow up VS's

## 2012-08-16 NOTE — Progress Notes (Signed)
Pt resting quietly. BP 134/66, trending up UOP decreasing 30 cc/3 hours EM:  +accels and improved variability with fetal scalp stimulation Edema up to thigh Cervix per RN now 8, suspects asynclitic presentation Mg previously 3.6  Plan: IVF to total of 100. Preeclampsia labs and  Magnesium level now. Increase Pitocin 2 by 2.

## 2012-08-16 NOTE — Progress Notes (Signed)
This note also relates to the following rows which could not be included: Pulse Rate - Cannot attach notes to unvalidated device data SpO2 - Cannot attach notes to unvalidated device data   Having difficulty determining FHR with maternal HR; discussed with pt. Trying to push with every other UC; pt unable to withhold pushing during UC

## 2012-08-16 NOTE — Progress Notes (Signed)
08/16/12 1135  Vitals  BP ! 172/86 mmHg  MAP (mmHg) 105  5mg  Hydralazine given IV

## 2012-08-17 MED ORDER — LABETALOL HCL 100 MG PO TABS
100.0000 mg | ORAL_TABLET | Freq: Once | ORAL | Status: AC
  Administered 2012-08-17: 100 mg via ORAL
  Filled 2012-08-17: qty 1

## 2012-08-17 MED ORDER — LABETALOL HCL 200 MG PO TABS
200.0000 mg | ORAL_TABLET | Freq: Two times a day (BID) | ORAL | Status: DC
  Administered 2012-08-17: 200 mg via ORAL
  Filled 2012-08-17 (×2): qty 1

## 2012-08-17 NOTE — Progress Notes (Signed)
Post Partum Day 1 Subjective: Still with complaint of bilateral hip pain and sharp lower abdominal pain.  Slowly improving. Pt has not ambulated due to Magnesium.  Baby doing well.  Objective: Blood pressure 154/95, pulse 100, temperature 98.4 F (36.9 C), temperature source Oral, resp. rate 18, height 5\' 5"  (1.651 m), weight 103.511 kg (228 lb 3.2 oz), last menstrual period 12/04/2011, SpO2 100.00%, unknown if currently breastfeeding.  Physical Exam:  General: alert, cooperative and no distress Lochia: appropriate Uterine Fundus: firm Incision: n/a DVT Evaluation: No evidence of DVT seen on physical exam. Calf/Ankle edema is present.   Recent Labs  08/16/12 0600 08/16/12 2020  HGB 11.9* 11.6*  HCT 34.6* 34.1*    Assessment/Plan: Plan for discharge tomorrow and Breastfeeding Increase Labetalol to 200 mg BID to optimize BP. H/o anxiety. Anticipate discharge tomorrow.  LOS: 10 days   Savannah Cole 08/17/2012, 1:29 PM

## 2012-08-17 NOTE — Anesthesia Postprocedure Evaluation (Signed)
  Anesthesia Post-op Note  Anesthesia Post Note  Patient: Savannah Cole  Procedure(s) Performed: * No procedures listed *  Anesthesia type: Epidural  Patient location: AICU  Post pain: Pain level controlled  Post assessment: Post-op Vital signs reviewed  Last Vitals:  Filed Vitals:   08/17/12 0800  BP: 146/78  Pulse: 98  Temp: 36.8 C  Resp: 18    Post vital signs: Reviewed  Level of consciousness:alert  Complications: No apparent anesthesia complications

## 2012-08-17 NOTE — Progress Notes (Signed)
UR chart review completed.  

## 2012-08-17 NOTE — Progress Notes (Signed)
Pt transferred ambulatory to [facility] rm #[id number]

## 2012-08-18 MED ORDER — LABETALOL HCL 200 MG PO TABS: 200.0000 mg | ORAL_TABLET | Freq: Two times a day (BID) | ORAL | Status: DC

## 2012-08-18 MED ORDER — IBUPROFEN 600 MG PO TABS: 600.0000 mg | ORAL_TABLET | Freq: Four times a day (QID) | ORAL | Status: DC

## 2012-08-18 NOTE — Discharge Summary (Signed)
Obstetric Discharge Summary Reason for Admission: R/o Preeclampsia superimposed Prenatal Procedures: NST, Preeclampsia and ultrasound Intrapartum Procedures: VBAC Postpartum Procedures: Magnesium sulfate x 24 hours Complications-Operative and Postpartum: none Started Labetalol 200 mg BID Hemoglobin  Date Value Range Status  08/16/2012 11.6* 12.0 - 15.0 g/dL Final     HCT  Date Value Range Status  08/16/2012 34.1* 36.0 - 46.0 % Final    Physical Exam:  General: alert NAD Lochia: appropriate Uterine Fundus: firm Incision: n/a DVT Evaluation: No evidence of DVT seen on physical exam.  Discharge Diagnoses: Preelampsia and s/p VBAC at 36 6/7 weeks  Discharge Information: Date: 08/18/2012 Activity: pelvic rest Diet: routine Medications: PNV, Ibuprofen and Labetalol  Condition: improved Instructions: See discharge instructions Discharge to: home Follow-up Information   Follow up with Geryl Rankins, MD. Schedule an appointment as soon as possible for a visit in 1 week. (BP Check)    Contact information:   [address], [address] [place] [address] [phone]       Follow up with Geryl Rankins, MD In 2 weeks. (Postpartum depression screen)    Contact information:   [address], [address] [place] [address] [phone]       Newborn Data: Live born female  Birth Weight: 7 lb 0.7 oz (3195 g) APGAR: 1, 6 Cord pH 7.09.  Magnesium on board Home with mother.  Geryl Rankins 08/18/2012, 9:17 AM

## 2012-08-18 NOTE — Progress Notes (Signed)
Patient complained of slight chest pain upon going over der DC instructions.   Patient stated she was just anxious with everything.  Oxygen saturations on Room Air 98%. Dr. Gunnar Bulla notified of patient's complaint.  No new orders.

## 2012-08-18 NOTE — Progress Notes (Signed)
Pt discharged before CSW could assess history of anxiety. 

## 2012-08-20 ENCOUNTER — Inpatient Hospital Stay (HOSPITAL_COMMUNITY): Payer: Commercial Managed Care - PPO

## 2012-08-20 ENCOUNTER — Encounter (HOSPITAL_COMMUNITY): Payer: Self-pay | Admitting: *Deleted

## 2012-08-20 ENCOUNTER — Inpatient Hospital Stay (HOSPITAL_COMMUNITY)
Admission: AD | Admit: 2012-08-20 | Discharge: 2012-08-23 | DRG: 776 | Disposition: A | Discharge status: Home / Self Care | Payer: Commercial Managed Care - PPO | Source: Ambulatory Visit | Attending: Obstetrics and Gynecology | Admitting: Obstetrics and Gynecology

## 2012-08-20 ENCOUNTER — Other Ambulatory Visit: Payer: Self-pay

## 2012-08-20 DIAGNOSIS — O99893 Other specified diseases and conditions complicating puerperium: Secondary | ICD-10-CM | POA: Diagnosis present

## 2012-08-20 DIAGNOSIS — I110 Hypertensive heart disease with heart failure: Secondary | ICD-10-CM | POA: Diagnosis present

## 2012-08-20 DIAGNOSIS — I509 Heart failure, unspecified: Secondary | ICD-10-CM | POA: Diagnosis present

## 2012-08-20 DIAGNOSIS — I5031 Acute diastolic (congestive) heart failure: Secondary | ICD-10-CM | POA: Diagnosis present

## 2012-08-20 DIAGNOSIS — K219 Gastro-esophageal reflux disease without esophagitis: Secondary | ICD-10-CM | POA: Diagnosis present

## 2012-08-20 DIAGNOSIS — J811 Chronic pulmonary edema: Secondary | ICD-10-CM | POA: Diagnosis present

## 2012-08-20 DIAGNOSIS — K5900 Constipation, unspecified: Secondary | ICD-10-CM | POA: Diagnosis present

## 2012-08-20 DIAGNOSIS — R0789 Other chest pain: Secondary | ICD-10-CM | POA: Diagnosis present

## 2012-08-20 DIAGNOSIS — IMO0002 Reserved for concepts with insufficient information to code with codable children: Principal | ICD-10-CM | POA: Diagnosis present

## 2012-08-20 DIAGNOSIS — R7989 Other specified abnormal findings of blood chemistry: Secondary | ICD-10-CM | POA: Diagnosis present

## 2012-08-20 DIAGNOSIS — O119 Pre-existing hypertension with pre-eclampsia, unspecified trimester: Secondary | ICD-10-CM | POA: Diagnosis present

## 2012-08-20 HISTORY — DX: Diaphragmatic hernia without obstruction or gangrene: K44.9

## 2012-08-20 HISTORY — DX: Chronic pulmonary edema: J81.1

## 2012-08-20 LAB — CBC
HCT: 33.9 % — ABNORMAL LOW (ref 36.0–46.0)
Hemoglobin: 11.6 g/dL — ABNORMAL LOW (ref 12.0–15.0)
MCH: 29.1 pg (ref 26.0–34.0)
MCHC: 33 g/dL (ref 30.0–36.0)
MCHC: 34.2 g/dL (ref 30.0–36.0)
Platelets: 351 10*3/uL (ref 150–400)
RDW: 14.4 % (ref 11.5–15.5)
WBC: 9.1 10*3/uL (ref 4.0–10.5)

## 2012-08-20 LAB — COMPREHENSIVE METABOLIC PANEL
ALT: 85 U/L — ABNORMAL HIGH (ref 0–35)
ALT: 79 U/L — ABNORMAL HIGH (ref 0–35)
AST: 63 U/L — ABNORMAL HIGH (ref 0–37)
Alkaline Phosphatase: 100 U/L (ref 39–117)
Alkaline Phosphatase: 98 U/L (ref 39–117)
CO2: 28 mEq/L (ref 19–32)
CO2: 26 mEq/L (ref 19–32)
Calcium: 9.6 mg/dL (ref 8.4–10.5)
Chloride: 104 mEq/L (ref 96–112)
Chloride: 101 mEq/L (ref 96–112)
GFR calc Af Amer: >90 mL/min (ref >90)
GFR calc Af Amer: >90 mL/min (ref >90)
GFR calc non Af Amer: >90 mL/min (ref >90)
GFR calc non Af Amer: >90 mL/min (ref >90)
Glucose, Bld: 103 mg/dL — ABNORMAL HIGH (ref 70–99)
Glucose, Bld: 131 mg/dL — ABNORMAL HIGH (ref 70–99)
Potassium: 3.7 mEq/L (ref 3.5–5.1)
Potassium: 3.9 mEq/L (ref 3.5–5.1)
Sodium: 140 mEq/L (ref 135–145)
Sodium: 136 mEq/L (ref 135–145)
Total Bilirubin: 0.2 mg/dL — ABNORMAL LOW (ref 0.3–1.2)

## 2012-08-20 LAB — URINALYSIS, ROUTINE W REFLEX MICROSCOPIC
Glucose, UA: NEGATIVE mg/dL
Protein, ur: 30 mg/dL — AB
pH: 7.5 (ref 5.0–8.0)

## 2012-08-20 LAB — PROTEIN / CREATININE RATIO, URINE
Creatinine, Urine: 60.84 mg/dL
Total Protein, Urine: 60.5 mg/dL

## 2012-08-20 LAB — MRSA PCR SCREENING: MRSA by PCR: NEGATIVE

## 2012-08-20 LAB — URINE MICROSCOPIC-ADD ON

## 2012-08-20 MED ORDER — HYDRALAZINE HCL 20 MG/ML IJ SOLN
5.0000 mg | INTRAMUSCULAR | Status: DC | PRN
  Administered 2012-08-20 – 2012-08-21 (×3): 5 mg via INTRAVENOUS
  Filled 2012-08-20 (×3): qty 1

## 2012-08-20 MED ORDER — FUROSEMIDE 10 MG/ML IJ SOLN
20.0000 mg | INTRAMUSCULAR | Status: DC
  Administered 2012-08-20: 20 mg via INTRAVENOUS
  Filled 2012-08-20: qty 2

## 2012-08-20 MED ORDER — SODIUM CHLORIDE 0.9 % IJ SOLN
3.0000 mL | Freq: Two times a day (BID) | INTRAMUSCULAR | Status: DC
  Administered 2012-08-20 – 2012-08-22 (×4): 3 mL via INTRAVENOUS

## 2012-08-20 MED ORDER — IBUPROFEN 600 MG PO TABS
600.0000 mg | ORAL_TABLET | Freq: Four times a day (QID) | ORAL | Status: DC | PRN
  Administered 2012-08-20 – 2012-08-22 (×3): 600 mg via ORAL
  Filled 2012-08-20 (×3): qty 1

## 2012-08-20 MED ORDER — HYDRALAZINE HCL 20 MG/ML IJ SOLN
10.0000 mg | Freq: Once | INTRAMUSCULAR | Status: AC
  Administered 2012-08-20: 10 mg via INTRAVENOUS
  Filled 2012-08-20: qty 1

## 2012-08-20 MED ORDER — SODIUM CHLORIDE 0.9 % IJ SOLN
3.0000 mL | INTRAMUSCULAR | Status: DC | PRN
  Administered 2012-08-22: 3 mL via INTRAVENOUS

## 2012-08-20 MED ORDER — PRENATAL MULTIVITAMIN CH
1.0000 | ORAL_TABLET | Freq: Every day | ORAL | Status: DC
  Administered 2012-08-21 – 2012-08-23 (×3): 1 via ORAL
  Filled 2012-08-20 (×4): qty 1

## 2012-08-20 MED ORDER — FUROSEMIDE 10 MG/ML IJ SOLN
20.0000 mg | Freq: Once | INTRAMUSCULAR | Status: DC
  Filled 2012-08-20: qty 2

## 2012-08-20 MED ORDER — OXYCODONE-ACETAMINOPHEN 5-325 MG PO TABS
1.0000 | ORAL_TABLET | ORAL | Status: DC | PRN
  Administered 2012-08-20: 2 via ORAL
  Administered 2012-08-21: 1 via ORAL
  Administered 2012-08-21 – 2012-08-22 (×5): 2 via ORAL
  Filled 2012-08-20: qty 1
  Filled 2012-08-20 (×6): qty 2

## 2012-08-20 MED ORDER — GI COCKTAIL ~~LOC~~
30.0000 mL | Freq: Once | ORAL | Status: AC
  Administered 2012-08-20: 30 mL via ORAL
  Filled 2012-08-20: qty 30

## 2012-08-20 MED ORDER — IOHEXOL 350 MG/ML SOLN
100.0000 mL | Freq: Once | INTRAVENOUS | Status: AC | PRN
  Administered 2012-08-20: 100 mL via INTRAVENOUS

## 2012-08-20 MED ORDER — SODIUM CHLORIDE 0.9 % IV SOLN
250.0000 mL | INTRAVENOUS | Status: DC | PRN
  Administered 2012-08-20: 250 mL via INTRAVENOUS

## 2012-08-20 MED ORDER — HYDRALAZINE HCL 25 MG PO TABS
25.0000 mg | ORAL_TABLET | Freq: Three times a day (TID) | ORAL | Status: DC
  Administered 2012-08-21 (×2): 25 mg via ORAL
  Filled 2012-08-20 (×6): qty 1

## 2012-08-20 MED ORDER — LABETALOL HCL 200 MG PO TABS
200.0000 mg | ORAL_TABLET | Freq: Two times a day (BID) | ORAL | Status: DC
  Administered 2012-08-20 (×2): 200 mg via ORAL
  Filled 2012-08-20: qty 2
  Filled 2012-08-20 (×2): qty 1

## 2012-08-20 NOTE — Progress Notes (Signed)
Pt rec'd from MAU via w/c to AICU rm #[id number] - report rec'd from Darl [place], [place]

## 2012-08-20 NOTE — MAU Provider Note (Signed)
Chief Complaint: Shortness of Breath   First Provider Initiated Contact with Patient 08/20/12 1417     SUBJECTIVE HPI: Savannah Cole is a 27 y.o. [id number] Postpartum Day 4 who presents to maternity admissions reporting SOB with exertion and epigastric pain.  She had VBAC on 08/16/12 and received magnesium sulfate for preeclampsia during her labor.  She reports normal lochia, and denies h/a, vaginal itching/burning, urinary symptoms, dizziness, n/v, or fever/chills.     Past Medical History  Diagnosis Date  . Anxiety   . Gonorrhea   . Chlamydia   . Hypertension   . Preeclampsia     With first pregnancy  . Gestational diabetes     With first pregnancy   Past Surgical History  Procedure Laterality Date  . Cesarean section    . Dilation and curettage of uterus      due to miscarriage   History   Social History  . Marital Status: Divorced    Spouse Name: N/A    Number of Children: N/A  . Years of Education: N/A   Occupational History  . Not on file.   Social History Main Topics  . Smoking status: Never Smoker   . Smokeless tobacco: Not on file  . Alcohol Use: 1.2 oz/week    2 Cans of beer per week  . Drug Use: No  . Sexually Active: Yes   Other Topics Concern  . Not on file   Social History Narrative  . No narrative on file   No current facility-administered medications on file prior to encounter.   Current Outpatient Prescriptions on File Prior to Encounter  Medication Sig Dispense Refill  . ibuprofen (ADVIL,MOTRIN) 600 MG tablet Take 1 tablet (600 mg total) by mouth every 6 (six) hours.  30 tablet  0  . labetalol (NORMODYNE) 200 MG tablet Take 1 tablet (200 mg total) by mouth 2 (two) times daily.  60 tablet  1  . Prenatal Vit-Fe Fumarate-FA (PRENATAL MULTIVITAMIN) TABS Take 1 tablet by mouth daily at 12 noon.      . ranitidine (ZANTAC) 150 MG capsule Take 150 mg by mouth daily as needed for heartburn.        No Known Allergies  ROS: Pertinent items in  HPI  OBJECTIVE Blood pressure 159/102, pulse 107, temperature 98.4 F (36.9 C), temperature source Oral, resp. rate 24, last menstrual period 12/04/2011, SpO2 98.00%, unknown if currently breastfeeding. Patient Vitals for the past 24 hrs:  BP Temp Temp src Pulse Resp SpO2  08/20/12 1500 166/97 mmHg - - 112 - -  08/20/12 1450 169/111 mmHg - - 107 - -  08/20/12 1440 166/107 mmHg - - 105 22 -  08/20/12 1436 173/113 mmHg - - 104 - -  08/20/12 1420 165/108 mmHg - - 111 - -  08/20/12 1410 160/106 mmHg - - 113 - -  08/20/12 1402 159/102 mmHg - - 107 24 98 %  08/20/12 1353 163/108 mmHg 98.4 F (36.9 C) Oral 108 28 100 %   GENERAL: Well-developed, well-nourished female in no acute distress.  HEENT: Normocephalic HEART: normal rate, rhythm, heart sounds RESP: normal effort, lung sounds clear bilaterally in all lobes ABDOMEN: Soft, non-tender EXTREMITIES: Nontender, 2+ pitting edema BLE NEURO: Alert and oriented   LAB RESULTS Results for orders placed during the hospital encounter of 08/20/12 (from the past 24 hour(s))  URINALYSIS, ROUTINE W REFLEX MICROSCOPIC     Status: Abnormal   Collection Time    08/20/12  1:40 PM  Result Value Range   Color, Urine YELLOW  YELLOW   APPearance CLEAR  CLEAR   Specific Gravity, Urine 1.020  1.005 - 1.030   pH 7.5  5.0 - 8.0   Glucose, UA NEGATIVE  NEGATIVE mg/dL   Hgb urine dipstick LARGE (*) NEGATIVE   Bilirubin Urine NEGATIVE  NEGATIVE   Ketones, ur NEGATIVE  NEGATIVE mg/dL   Protein, ur 30 (*) NEGATIVE mg/dL   Urobilinogen, UA 0.2  0.0 - 1.0 mg/dL   Nitrite NEGATIVE  NEGATIVE   Leukocytes, UA TRACE (*) NEGATIVE  URINE MICROSCOPIC-ADD ON     Status: None   Collection Time    08/20/12  1:40 PM      Result Value Range   Squamous Epithelial / LPF RARE  RARE   RBC / HPF 3-6  <3 RBC/hpf   Bacteria, UA RARE  RARE  COMPREHENSIVE METABOLIC PANEL     Status: Abnormal   Collection Time    08/20/12  2:36 PM      Result Value Range   Sodium  140  135 - 145 mEq/L   Potassium 3.7  3.5 - 5.1 mEq/L   Chloride 104  96 - 112 mEq/L   CO2 28  19 - 32 mEq/L   Glucose, Bld 103 (*) 70 - 99 mg/dL   BUN 15  6 - 23 mg/dL   Creatinine, Ser 1.61  0.50 - 1.10 mg/dL   Calcium 9.6  8.4 - 09.6 mg/dL   Total Protein 5.6 (*) 6.0 - 8.3 g/dL   Albumin 2.4 (*) 3.5 - 5.2 g/dL   AST 63 (*) 0 - 37 U/L   ALT 85 (*) 0 - 35 U/L   Alkaline Phosphatase 100  39 - 117 U/L   Total Bilirubin 0.2 (*) 0.3 - 1.2 mg/dL   GFR calc non Af Amer >90  >90 mL/min   GFR calc Af Amer >90  >90 mL/min  CBC     Status: Abnormal   Collection Time    08/20/12  2:36 PM      Result Value Range   WBC 9.1  4.0 - 10.5 K/uL   RBC 3.95  3.87 - 5.11 MIL/uL   Hemoglobin 11.5 (*) 12.0 - 15.0 g/dL   HCT 04.5 (*) 40.9 - 81.1 %   MCV 88.1  78.0 - 100.0 fL   MCH 29.1  26.0 - 34.0 pg   MCHC 33.0  30.0 - 36.0 g/dL   RDW 91.4  78.2 - 95.6 %   Platelets 351  150 - 400 K/uL     ASSESSMENT Postpartum preeclampsia  PLAN GI Cocktail given with some relief of epigastric pain, PIH labs Called Dr Dion Body to discuss assessment and plan Admit to ICU Pt breastfeeding and reassurance provided that baby will room-in with pt and hospital will provide breastpump if needed.   Sharen Counter Certified Nurse-Midwife 08/20/2012  2:19 PM

## 2012-08-20 NOTE — H&P (Addendum)
Savannah Cole is an 27 y.o. female [phone] s/p VBAC postpartum day #4.  H/o CHTN developed mild Preeclampsia 1 week prior to delivery.  Developed severe Preeclampsia (severe range BP, oliguria).  Received Magnesium sulfate x 24 hours for seizure prophylaxis.  Pt discharged on postpartum day #2 on Labetalol 200 mg BID.  BPs were stable 140s/80s x 24 hours.  Pt presented 48 hours after discharge reported an epigastric pain that she thought was reflux.  Unrelieved by OTC medications.  Started to have  SOB, chest pain. Pt has a h/o anxiety so she thought that was contributing to her symptoms. Upon arrival home pt states she slowly had difficulty breathing and extreme fatigue.  SOB would worsen with exertion.  Had difficulty caring for baby and performing ADLs.  Took her 2 hours to take a shower.  She can not lay flat.  Hurts to lay on either side.  Denies orthopnea.  Pt went to her daughter's pediatrician visit this morning, at which time she had difficulty completing her sentences and had extreme LE edema.  She was encouraged to call me ASAP.  Currently, pt denies HA, visual changes.  Epigastric pain is intermittent.  Breast feeding well.   Reports her legs are very heavy but she continues to ambulate without difficulty.  Pt reports uterine cramping, controlled with Ibuprofen, minimal vaginal bleeding.     Past Medical History  Diagnosis Date  . Anxiety   . Gonorrhea   . Chlamydia   . Hypertension   . Preeclampsia     With first pregnancy  . Gestational diabetes     With first pregnancy  Vulvodynia   POBHx:  4 SABs with previous partner.   Primary c-section, Preeclampsiat 12 years ago. VBAC at 36 6/7 weeks, CHTN, Superimposed preeclampsia, severe  PGYN:  Chl +, Vulvodynia Past Surgical History  Procedure Laterality Date  . Cesarean section    . Dilation and curettage of uterus      due to miscarriage    Family History  Problem Relation Age of Onset  . Hypertension Father   .  Diabetes Father   . Hypertension Maternal Grandmother   . Diabetes Maternal Grandmother     Social History:  reports that she has never smoked. She does not have any smokeless tobacco history on file. She reports that she drinks about 1.2 ounces of alcohol per week. She reports that she does not use illicit drugs.  Allergies: No Known Allergies  Prescriptions prior to admission  Medication Sig Dispense Refill  . alum & mag hydroxide-simeth (MAALOX/MYLANTA) 200-200-20 MG/5ML suspension Take 10 mLs by mouth every 6 (six) hours as needed for indigestion.      Marland Kitchen ibuprofen (ADVIL,MOTRIN) 600 MG tablet Take 1 tablet (600 mg total) by mouth every 6 (six) hours.  30 tablet  0  . labetalol (NORMODYNE) 200 MG tablet Take 1 tablet (200 mg total) by mouth 2 (two) times daily.  60 tablet  1  . Prenatal Vit-Fe Fumarate-FA (PRENATAL MULTIVITAMIN) TABS Take 1 tablet by mouth daily at 12 noon.      . ranitidine (ZANTAC) 150 MG capsule Take 150 mg by mouth daily as needed for heartburn.         Review of Systems  Constitutional: Negative for fever and chills.  Eyes: Negative for blurred vision.  Respiratory: Positive for shortness of breath.   Cardiovascular: Positive for chest pain and leg swelling.  Gastrointestinal: Positive for heartburn. Negative for nausea and vomiting.  Genitourinary:  Minimal vaginal bleeding  Musculoskeletal: Positive for joint pain.  Neurological: Positive for dizziness. Negative for focal weakness and headaches.  Psychiatric/Behavioral: The patient is nervous/anxious.     Blood pressure 152/81, pulse 132, temperature 99.7 F (37.6 C), temperature source Oral, resp. rate 30, height 5\' 5"  (1.651 m), weight 101.696 kg (224 lb 3.2 oz), last menstrual period 12/04/2011, SpO2 98.00%, currently breastfeeding. Physical Exam  Vitals reviewed. Constitutional: She is oriented to person, place, and time. She appears well-developed and well-nourished. No distress.  HENT:  Head:  Normocephalic and atraumatic.  Eyes: EOM are normal.  Neck: Normal range of motion.  Cardiovascular: Normal rate, regular rhythm and normal heart sounds.   No murmur heard. Respiratory: She is in respiratory distress.  Diminished breath sounds at bases, no crackles, unable to finish complete sentences  GI: She exhibits no distension.  Normal postpartum uterus, mild to moderate tenderness with fundal massage  Genitourinary:  Deferred.  Musculoskeletal: Normal range of motion. She exhibits edema and tenderness.  3++ edema up to mid thigh, worsened from time of discharge, tenderness likely due to swelling, no discolorations of LE  Neurological: She is alert and oriented to person, place, and time. She has normal reflexes.  DTR 2+  Skin: Skin is warm and dry. No rash noted. She is not diaphoretic. No erythema.  Psychiatric: She has a normal mood and affect.    Results for orders placed during the hospital encounter of 08/20/12 (from the past 24 hour(s))  URINALYSIS, ROUTINE W REFLEX MICROSCOPIC     Status: Abnormal   Collection Time    08/20/12  1:40 PM      Result Value Range   Color, Urine YELLOW  YELLOW   APPearance CLEAR  CLEAR   Specific Gravity, Urine 1.020  1.005 - 1.030   pH 7.5  5.0 - 8.0   Glucose, UA NEGATIVE  NEGATIVE mg/dL   Hgb urine dipstick LARGE (*) NEGATIVE   Bilirubin Urine NEGATIVE  NEGATIVE   Ketones, ur NEGATIVE  NEGATIVE mg/dL   Protein, ur 30 (*) NEGATIVE mg/dL   Urobilinogen, UA 0.2  0.0 - 1.0 mg/dL   Nitrite NEGATIVE  NEGATIVE   Leukocytes, UA TRACE (*) NEGATIVE  PROTEIN / CREATININE RATIO, URINE     Status: Abnormal   Collection Time    08/20/12  1:40 PM      Result Value Range   Creatinine, Urine 60.84     Total Protein, Urine 60.5     PROTEIN CREATININE RATIO 0.99 (*) 0.00 - 0.15  URINE MICROSCOPIC-ADD ON     Status: None   Collection Time    08/20/12  1:40 PM      Result Value Range   Squamous Epithelial / LPF RARE  RARE   RBC / HPF 3-6  <3  RBC/hpf   Bacteria, UA RARE  RARE  COMPREHENSIVE METABOLIC PANEL     Status: Abnormal   Collection Time    08/20/12  2:36 PM      Result Value Range   Sodium 140  135 - 145 mEq/L   Potassium 3.7  3.5 - 5.1 mEq/L   Chloride 104  96 - 112 mEq/L   CO2 28  19 - 32 mEq/L   Glucose, Bld 103 (*) 70 - 99 mg/dL   BUN 15  6 - 23 mg/dL   Creatinine, Ser 1.61  0.50 - 1.10 mg/dL   Calcium 9.6  8.4 - 09.6 mg/dL   Total Protein 5.6 (*) 6.0 -  8.3 g/dL   Albumin 2.4 (*) 3.5 - 5.2 g/dL   AST 63 (*) 0 - 37 U/L   ALT 85 (*) 0 - 35 U/L   Alkaline Phosphatase 100  39 - 117 U/L   Total Bilirubin 0.2 (*) 0.3 - 1.2 mg/dL   GFR calc non Af Amer >90  >90 mL/min   GFR calc Af Amer >90  >90 mL/min  CBC     Status: Abnormal   Collection Time    08/20/12  2:36 PM      Result Value Range   WBC 9.1  4.0 - 10.5 K/uL   RBC 3.95  3.87 - 5.11 MIL/uL   Hemoglobin 11.5 (*) 12.0 - 15.0 g/dL   HCT 04.5 (*) 40.9 - 81.1 %   MCV 88.1  78.0 - 100.0 fL   MCH 29.1  26.0 - 34.0 pg   MCHC 33.0  30.0 - 36.0 g/dL   RDW 91.4  78.2 - 95.6 %   Platelets 351  150 - 400 K/uL  MRSA PCR SCREENING     Status: None   Collection Time    08/20/12  5:40 PM      Result Value Range   MRSA by PCR NEGATIVE  NEGATIVE    Chest X-ray Pa And Lat  08/20/2012   *RADIOLOGY REPORT*  Clinical Data: Postpartum.  Shortness of breath.  Cough and chest tightness for several days  CHEST - 2 VIEW  Comparison: None.  Findings: Bibasilar alveolar infiltrates, right greater than left are identified. Bilateral pleural effusions, left greater than right are identified as is some fissural prominence and central peribronchial cuffing. Heart size is normal for a postpartum patient.  The appearance is suspicious for noncardiogenic pulmonary edema with basilar edema fluid. In the appropriate clinical setting, a right basilar pneumonia is not excluded.  Follow-up to ensure resolution would be recommended post therapeutically.  Bony structures appear intact.   IMPRESSION: Findings suspicious for noncardiogenic pulmonary edema.  More focal infiltrate at the right base may be due to positional alveolar edema with pneumonia not completely excluded.   Original Report Authenticated By: Rhodia Albright, M.D.   Ct Angio Chest Pe W/cm &/or Wo Cm  08/20/2012   *RADIOLOGY REPORT*  Clinical Data: Worsening shortness of breath.  Chest discomfort. Status post vaginal delivery on 08/16/2012.  Treated for preeclampsia during labor.  Normal white blood cell count and no fever.  CT ANGIOGRAPHY CHEST  Technique:  Multidetector CT imaging of the chest using the standard protocol during bolus administration of intravenous contrast. Multiplanar reconstructed images including MIPs were obtained and reviewed to evaluate the vascular anatomy.  Contrast: OMNIPAQUE IOHEXOL 350 MG/ML SOLN  Comparison: Chest radiographs obtained earlier today.  Findings: Normally opacified pulmonary arteries with no pulmonary arterial filling defects seen.  Patchy prominence of the interstitial markings in both lower lobes and in the right middle lobe.  There are also multiple patchy and confluent areas of airspace opacity in both lower lobes, right middle lobe and in the right upper lobe.  Small to moderate-sized bilateral pleural effusions.  Diffuse subcutaneous edema.  Small to moderate-sized hiatal hernia.  Normal sized heart.  Minimal thoracic spine degenerative changes.  Unremarkable upper abdomen.  IMPRESSION:  1.  No pulmonary emboli. 2.  Findings most compatible with congestive heart failure with interstitial and alveolar edema as well as bilateral pleural effusions and diffuse subcutaneous edema. 3.  Small to moderate-sized hiatal hernia.   Original Report Authenticated By: Beckie Salts,  M.D.    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   Cardiology consult requested.  Spoke with cardiologist on night call,  Dr.  Terressa Koyanagi.  Recommend discontinuing Labetalol for now.  Start Hydralazine 25 mg TID and IV Hydralazine prn for BP 165/105.  He placed order for cardiac echo to be done in am.    Given Lasix 20 mg IV and diuresed 3 liters.  He recommended holding additional Lasix until in the morning or if she  becomes symptomatic. CHTN h/o superimposed Preeclampsia, severe.  BP uncontrolled.  BP medication as above. Chest pain/ epigastric pain. Hiatal hernia-mild to moderate  Ruled out pulmonary embolus.  Pain likely due to pulmonary edema. Lower extremity edema  Continue diuresis.    Encouraged ambulation. Mildly elevated LFTs  Repeat PIH labs to follow trend. Breast feeding  Breast pump to bedside.  Lactation consult for needs assessment.  Ok for pt to continue to breast feed at this time per cardiology. Plan and all results d/w pt at length.  All questions answered. Dr. Richardson Dopp will be on in the morning.  Pt aware.  Geryl Rankins 08/20/2012, 11:11 PM

## 2012-08-20 NOTE — MAU Note (Addendum)
PP, vaginal delivery 7/27. States she had preeclampsia. Received Magnesium. Was a little SOB on Discharge, but when she started moving around more it became worse. SOB worse today. States she has "chest discomfort, not really pain."

## 2012-08-21 ENCOUNTER — Inpatient Hospital Stay (HOSPITAL_COMMUNITY): Payer: Commercial Managed Care - PPO

## 2012-08-21 ENCOUNTER — Encounter (HOSPITAL_COMMUNITY): Payer: Self-pay | Admitting: Physician Assistant

## 2012-08-21 DIAGNOSIS — J810 Acute pulmonary edema: Secondary | ICD-10-CM

## 2012-08-21 DIAGNOSIS — I5031 Acute diastolic (congestive) heart failure: Secondary | ICD-10-CM | POA: Diagnosis present

## 2012-08-21 DIAGNOSIS — I059 Rheumatic mitral valve disease, unspecified: Secondary | ICD-10-CM

## 2012-08-21 LAB — CBC
HCT: 34.9 % — ABNORMAL LOW (ref 36.0–46.0)
RDW: 14.6 % (ref 11.5–15.5)
WBC: 8.6 10*3/uL (ref 4.0–10.5)

## 2012-08-21 LAB — COMPREHENSIVE METABOLIC PANEL
Alkaline Phosphatase: 97 U/L (ref 39–117)
BUN: 14 mg/dL (ref 6–23)
Calcium: 8.8 mg/dL (ref 8.4–10.5)
GFR calc Af Amer: >90 mL/min (ref >90)
Glucose, Bld: 95 mg/dL (ref 70–99)
Potassium: 3.9 mEq/L (ref 3.5–5.1)
Total Protein: 5.9 g/dL — ABNORMAL LOW (ref 6.0–8.3)

## 2012-08-21 LAB — TROPONIN I: Troponin I: <0.3 ng/mL (ref <0.30)

## 2012-08-21 MED ORDER — FUROSEMIDE 10 MG/ML IJ SOLN
20.0000 mg | Freq: Once | INTRAMUSCULAR | Status: AC
  Administered 2012-08-21: 20 mg via INTRAVENOUS

## 2012-08-21 MED ORDER — LABETALOL HCL 200 MG PO TABS
200.0000 mg | ORAL_TABLET | Freq: Two times a day (BID) | ORAL | Status: DC
  Filled 2012-08-21: qty 1

## 2012-08-21 MED ORDER — LACTATED RINGERS IV SOLN
INTRAVENOUS | Status: DC
  Administered 2012-08-21: 14:00:00 via INTRAVENOUS

## 2012-08-21 MED ORDER — MAGNESIUM SULFATE 40 G IN LACTATED RINGERS - SIMPLE
2.0000 g/h | INTRAVENOUS | Status: DC
  Filled 2012-08-21: qty 500

## 2012-08-21 MED ORDER — FUROSEMIDE 10 MG/ML IJ SOLN
20.0000 mg | Freq: Once | INTRAMUSCULAR | Status: AC
  Administered 2012-08-21: 20 mg via INTRAVENOUS
  Filled 2012-08-21: qty 2

## 2012-08-21 MED ORDER — MAGNESIUM SULFATE BOLUS VIA INFUSION
4.0000 g | Freq: Once | INTRAVENOUS | Status: AC
  Administered 2012-08-21: 4 g via INTRAVENOUS
  Filled 2012-08-21: qty 500

## 2012-08-21 MED ORDER — FUROSEMIDE 20 MG PO TABS
20.0000 mg | ORAL_TABLET | Freq: Every day | ORAL | Status: DC
  Filled 2012-08-21: qty 1

## 2012-08-21 MED ORDER — LABETALOL HCL 100 MG PO TABS
100.0000 mg | ORAL_TABLET | Freq: Once | ORAL | Status: AC
  Administered 2012-08-21: 100 mg via ORAL
  Filled 2012-08-21: qty 1

## 2012-08-21 MED ORDER — LABETALOL HCL 100 MG PO TABS
100.0000 mg | ORAL_TABLET | Freq: Two times a day (BID) | ORAL | Status: DC
  Administered 2012-08-21 (×2): 100 mg via ORAL
  Filled 2012-08-21 (×4): qty 1

## 2012-08-21 NOTE — Progress Notes (Signed)
Echo Lab  2D Echocardiogram completed.  Bethzaida Boord L Ardit Danh, RDCS 08/21/2012 8:37 AM

## 2012-08-21 NOTE — Consult Note (Signed)
Maternal Fetal Medicine Consultation  Requesting Provider(s): Gerald Leitz, MD  Reason for consultation: Pulmonary edema, post partum preeclampsia  HPI: Savannah Cole  Is a 27 yo [id number] currently PPD #4 who is currently admitted due to pulmonary edema and post partum preeclampsia.  The patient has a history of chronic hypertension (although never required medications outside of pregnancy) who was admitted approximately 1 week prior to delivery due to elevated blood pressures.  She went into labor spontaneously at 36 weeks and had a normal SVD.  She was treated with Magnesium sulfate for 24 hrs post partum and was discharged home on po Labetalol.  Some 48 hrs after discharge, she developed shortness of breath and diffuse lower extremity swelling.  Her CXR was remarkable for bilateral pleural effusions and bibasilar alveolar infiltrates consistent with non cardiac pulmonary edema.  CT angio was negative for pulmonary embolus and cardiac echo showed an EF of 50% without evidence of cardiomyopathy.  Admission labs showed proteinuria and elevated LFTs.  She has responded well to IV Lasix and breathing much improved.  Earlier today, she reported worsening headaches - she is currently on IV magnesium sulfate.  She has required several doses of IV Hydralazine since admission.  OB History: OB History   Grav Para Term Preterm Abortions TAB SAB Ect Mult Living   6 2 1 1 4  3   2       PMH:  Past Medical History  Diagnosis Date  . Anxiety   . Gonorrhea   . Chlamydia   . Hypertension   . Preeclampsia     With first pregnancy and 07/2012 pregnancy.  . Gestational diabetes     With first pregnancy  . Hiatal hernia     a. Small-mod hiatal hernia seen on CT 07/2012.  . Pulmonary edema     a. Post-partum CHF 07/2012.    PSH:  Past Surgical History  Procedure Laterality Date  . Cesarean section    . Dilation and curettage of uterus      due to miscarriage   Meds:  Scheduled Meds: . [START ON  08/22/2012] furosemide  20 mg Oral Daily  . labetalol  100 mg Oral BID  . prenatal multivitamin  1 tablet Oral Q1200  . sodium chloride  3 mL Intravenous Q12H   Continuous Infusions: . lactated ringers 30 mL/hr at 08/21/12 1400  . magnesium sulfate 2 g/hr (08/21/12 1600)   PRN Meds:.sodium chloride, hydrALAZINE, ibuprofen, oxyCODONE-acetaminophen, sodium chloride  Allergies: No Known Allergies  FH: + family history of diabetes, CHTN  Soc: denies any current tobacco or ETOH use  Review of Systems: no vaginal bleeding or cramping/contractions, no LOF, no nausea/vomiting. All other systems reviewed and are negative.  PE:   Filed Vitals:   08/21/12 1700  BP: 150/80  Pulse: 108  Temp:   Resp: 29   BPs: 160/79, 162/87, 158/100, 156/96, 167/92, 172/93 O2 Sats: 95-100% (RA) UO: 5225 ml since midnight last night  GEN: well-appearing female ABD: gravid, NT Cor: RRR without murmurs Lungs: CTA Ext: 3+ pitting edema  Labs: CBC    Component Value Date/Time   WBC 8.6 08/21/2012 0935   RBC 3.98 08/21/2012 0935   HGB 11.8* 08/21/2012 0935   HCT 34.9* 08/21/2012 0935   PLT 371 08/21/2012 0935   MCV 87.7 08/21/2012 0935   MCH 29.6 08/21/2012 0935   MCHC 33.8 08/21/2012 0935   RDW 14.6 08/21/2012 0935   LYMPHSABS 2.1 09/06/2010 1333   MONOABS 0.6 09/06/2010  1333   EOSABS 0.0 09/06/2010 1333   BASOSABS 0.0 09/06/2010 1333   CMP     Component Value Date/Time   NA 139 08/21/2012 0935   K 3.9 08/21/2012 0935   CL 102 08/21/2012 0935   CO2 27 08/21/2012 0935   GLUCOSE 95 08/21/2012 0935   BUN 14 08/21/2012 0935   CREATININE 0.81 08/21/2012 0935   CREATININE 0.58 08/07/2012 1108   CALCIUM 8.8 08/21/2012 0935   PROT 5.9* 08/21/2012 0935   ALBUMIN 2.4* 08/21/2012 0935   AST 45* 08/21/2012 0935   ALT 78* 08/21/2012 0935   ALKPHOS 97 08/21/2012 0935   BILITOT 0.2* 08/21/2012 0935   GFRNONAA >90 08/21/2012 0935   GFRAA >90 08/21/2012 0935     A/P: 1) PPD #4 s/p SVD at 36 weeks         2) Post partum preeclampsia - concur  with work up thus far.  There is no evidence of peripartum cardiomyopathy, and suspect that pulmonary edema is likely due to hypertension / preeclampsia. Would continue diuresis with Lasix as recommended by Cardiology - will need to check daily electrolytes and replace potassium if needed.  Agree with Magnesium sulfate prophylaxis for 24-hrs.  Would resume Labetalol - but will likely need to increase the dose to at least 400 mg BID and titrate dose to maintain BPs in the  140/90's range if possible.  I would anticipate a quick recovery - if BPs better controlled, she will likely be able to be discharged over the weekend.  After discharge, would recommend BP check within one week and adjusting medications as appropriate.   Thank you for the opportunity to be a part of the care of Savannah Cole. Please contact our office if we can be of further assistance.   I spent approximately 30 minutes with this patient with over 50% of time spent in face-to-face counseling.  Milinda Antis Maternal-Fetal Medicine

## 2012-08-21 NOTE — Consult Note (Signed)
Lactation Note - Mom readmitted to AICU. Dad and baby with mom - mom is pumping and baby being bottle fed EBM. Mom denies needing any help or having any questions at this time. Mom knows to call for questions/concerns.

## 2012-08-21 NOTE — Progress Notes (Signed)
Savannah Cole admitted for sob/ tachypenia and pulmonary edema Subjective: Pt feels better after having lasix this morning and this afternoon. Still has some sob with talking. She has a headache and received percocet she now has pruritus.  Denies visual changes or  ruq pain.   Objective: Vital signs in last 24 hours: Temp:  [97.8 F (36.6 C)-99.7 F (37.6 C)] 98.3 F (36.8 C) (08/01 1600) Pulse Rate:  [91-133] 108 (08/01 1700) Resp:  [20-42] 29 (08/01 1700) BP: (141-183)/(68-110) 150/80 mmHg (08/01 1700) SpO2:  [95 %-100 %] 96 % (08/01 1700) Weight:  [98.91 kg (218 lb 0.9 oz)] 98.91 kg (218 lb 0.9 oz) (08/01 0553) Last BM Date: 08/19/12  Intake/Output from previous day: 07/31 0701 - 08/01 0700 In: 1458.8 [P.O.:1370; I.V.:88.8] Out: 5950 [Urine:5950] Intake/Output this shift: Total I/O In: 672 [P.O.:600; I.V.:72] Out: 3625 [Urine:3625]  General appearance: alert, cooperative and no distress Resp: clear to auscultation bilaterally Cardio: regular rate and rhythm, S1, S2 normal, no murmur, click, rub or gallop Extremities: edema 2 + to half way up calf. .. reflexes are diminished after starting magnesium were 2+ this am   Lab Results:   Recent Labs  08/20/12 2320 08/21/12 0935  WBC 9.1 8.6  HGB 11.6* 11.8*  HCT 33.9* 34.9*  PLT 386 371   BMET  Recent Labs  08/20/12 2320 08/21/12 0935  NA 136 139  K 3.9 3.9  CL 101 102  CO2 26 27  GLUCOSE 131* 95  BUN 16 14  CREATININE 0.80 0.81  CALCIUM 9.5 8.8   PT/INR No results found for this basename: LABPROT, INR,  in the last 72 hours ABG No results found for this basename: PHART, PCO2, PO2, HCO3,  in the last 72 hours  Studies/Results: Chest X-ray Pa And Lat  08/20/2012   *RADIOLOGY REPORT*  Clinical Data: Postpartum.  Shortness of breath.  Cough and chest tightness for several days  CHEST - 2 VIEW  Comparison: None.  Findings: Bibasilar alveolar infiltrates, right greater than left are identified. Bilateral pleural  effusions, left greater than right are identified as is some fissural prominence and central peribronchial cuffing. Heart size is normal for a postpartum patient.  The appearance is suspicious for noncardiogenic pulmonary edema with basilar edema fluid. In the appropriate clinical setting, a right basilar pneumonia is not excluded.  Follow-up to ensure resolution would be recommended post therapeutically.  Bony structures appear intact.  IMPRESSION: Findings suspicious for noncardiogenic pulmonary edema.  More focal infiltrate at the right base may be due to positional alveolar edema with pneumonia not completely excluded.   Original Report Authenticated By: Rhodia Albright, M.D.   Ct Angio Chest Pe W/cm &/or Wo Cm  08/20/2012   *RADIOLOGY REPORT*  Clinical Data: Worsening shortness of breath.  Chest discomfort. Status post vaginal delivery on 08/16/2012.  Treated for preeclampsia during labor.  Normal white blood cell count and no fever.  CT ANGIOGRAPHY CHEST  Technique:  Multidetector CT imaging of the chest using the standard protocol during bolus administration of intravenous contrast. Multiplanar reconstructed images including MIPs were obtained and reviewed to evaluate the vascular anatomy.  Contrast: OMNIPAQUE IOHEXOL 350 MG/ML SOLN  Comparison: Chest radiographs obtained earlier today.  Findings: Normally opacified pulmonary arteries with no pulmonary arterial filling defects seen.  Patchy prominence of the interstitial markings in both lower lobes and in the right middle lobe.  There are also multiple patchy and confluent areas of airspace opacity in both lower lobes, right middle lobe  and in the right upper lobe.  Small to moderate-sized bilateral pleural effusions.  Diffuse subcutaneous edema.  Small to moderate-sized hiatal hernia.  Normal sized heart.  Minimal thoracic spine degenerative changes.  Unremarkable upper abdomen.  IMPRESSION:  1.  No pulmonary emboli. 2.  Findings most compatible  with congestive heart failure with interstitial and alveolar edema as well as bilateral pleural effusions and diffuse subcutaneous edema. 3.  Small to moderate-sized hiatal hernia.   Original Report Authenticated By: Beckie Salts, M.D.    Anti-infectives: Anti-infectives   None      Assessment/Plan: Savannah Cole with pulmonary edema most likely due to preeclampsia. Lasix 40 mg daily per cardiology recommendation. Given continued tachpnea repeat chest xray...  Cardiac echo with ejection fraction of 50 %... magnesium started due to severe preeclamptic signs.. plan to continue this for 24 hours. Appreciate Cardiology consult Hypertension  Continue labetalol  MFM consult note pending.   plan to follow labs with repeat cbc / cmp and mag level in am   LOS: 1 day    Arslan Kier J. 08/21/2012

## 2012-08-21 NOTE — Consult Note (Addendum)
CARDIOLOGY CONSULT NOTE  Patient ID: Savannah Cole, MRN: [id number], DOB/AGE: 1985-05-18 27 y.o. Admit date: 08/20/2012   Date of Consult: 08/21/2012 Primary Physician: Loreen Freud, DO Primary Cardiologist: New to [facility], being seen by Dr. Shirlee Latch  Chief Complaint: SOB Reason for Consult: pulmonary edema  HPI: Savannah Cole is a 27 y/o [id number] female with history of pre-eclampsia & HTN who presented to [facility] yesterday postpartum day #4 ([redacted]w[redacted]d) with complaints of dyspnea and LE edema who we are asked to see for possible CHF.  She was in the hospital 7/18-7/29 after being admitted at 35 weeks for pre-eclampsia.  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 This pregnancy was complicated by HTN diagnosed in the first trimester into the 160's. Labetalol 100 mg BID was initially started as outpatient but pt discontinued due to headaches, and this was later resumed at 50mg  BID. She presented last admission on 08/07/12 with sudden LEE. She was found to have proteinuria and elevated BP. She delivered VBAC on 08/16/12 with post-op Mag Sulfate given for seizure prophylaxis. Labetalol was increased to 200mg  BID for elevated BP and she was discharged home.   She presented back to [facility] yesterday with complaints of dyspnea and orthopnea. She noticed she had difficulty with ADL's and caring for the baby. She initially thought it was anxiety. She had also had epigastric pain and some chest pain when she was experiencing the SOB at home. She went to her daughter's pediatrician visit that morning, and during that visit, the doctor noticed she had difficulty completing her sentences and extreme LE edema. She was directed to the ER. CXR showed findings suspicious for noncardiogenic pulmonary edema, more focal infiltrate at the right base may be due to positional alveolar edema with  pneumonia not completely excluded. CT angio 7/31 showed no PE but findings most compatibue with congestive heart failure with interstitial and alveolar edema as well as bilateral pleural effusions and diffuse subcutaneous edema. Small-mod hiatal hernia also noted. Albumin 2.4 (significant proteinuria last admission), hepatic function panel mildly elevated, WBC WNL, Hgb stable.   Labetalol 200mg  BID was given yesterday, but later discontinued after primary team discussed with overnight fellow in setting of acute decompensation. SHe also appears to have received 20mg  IV Lasix and IV hydralazine yesterday evening. Hydralazine 25mg  TID was started. She has gotten 20mg  IV Lasix this AM and was just given another 20. Peak BP 170/110. Weight 228 prior to delivery, 224 on admission, 218 today with nearly -5L diuresis thus far. Still tachycardic but feeling much better than yesterday. Less dyspneic with ambulation. LEE is better but still present.   Past Medical History  Diagnosis Date  . Anxiety   . Gonorrhea   . Chlamydia   . Hypertension   . Preeclampsia     With first pregnancy  . Gestational diabetes     With first pregnancy      Most Recent Cardiac Studies: 2D Echo 08/21/12 - Left ventricle: The cavity size was normal. Wall thickness was increased in a pattern of mild LVH. The estimated ejection fraction was 50%. Wall motion was normal; there were no regional wall motion abnormalities. Due to tachycardia, there was fusion of early and atrial contributions to ventricular filling. - Mitral valve: Mild regurgitation. - Atrial septum: No defect or patent foramen ovale was identified. - Pulmonary arteries: PA peak pressure: 34mm Hg (S).  Surgical History:  Past Surgical History  Procedure Laterality Date  . Cesarean section    . Dilation and curettage of uterus      due to miscarriage     Home Meds: Prior to Admission medications   Medication Sig Start Date End Date Taking? Authorizing  Provider  alum & mag hydroxide-simeth (MAALOX/MYLANTA) 200-200-20 MG/5ML suspension Take 10 mLs by mouth every 6 (six) hours as needed for indigestion.   Yes Historical Provider, MD  ibuprofen (ADVIL,MOTRIN) 600 MG tablet Take 1 tablet (600 mg total) by mouth every 6 (six) hours. 08/18/12  Yes Geryl Rankins, MD  labetalol (NORMODYNE) 200 MG tablet Take 1 tablet (200 mg total) by mouth 2 (two) times daily. 08/18/12  Yes Geryl Rankins, MD  Prenatal Vit-Fe Fumarate-FA (PRENATAL MULTIVITAMIN) TABS Take 1 tablet by mouth daily at 12 noon.   Yes Historical Provider, MD  ranitidine (ZANTAC) 150 MG capsule Take 150 mg by mouth daily as needed for heartburn.    Yes Historical Provider, MD    Inpatient Medications:  . hydrALAZINE  25 mg Oral Q8H  . prenatal multivitamin  1 tablet Oral Q1200  . sodium chloride  3 mL Intravenous Q12H      Allergies: No Known Allergies  History   Social History  . Marital Status: Divorced    Spouse Name: N/A    Number of Children: N/A  . Years of Education: N/A   Occupational History  . Not on file.   Social History Main Topics  . Smoking status: Never Smoker   . Smokeless tobacco: Not on file  . Alcohol Use: 1.2 oz/week    2 Cans of beer per week  . Drug Use: No  . Sexually Active: Yes   Other Topics Concern  . Not on file   Social History Narrative  . No narrative on file     Family History  Problem Relation Age of Onset  . Hypertension Father   . Diabetes Father   . Hypertension Maternal Grandmother   . Diabetes Maternal Grandmother      Review of Systems: General: negative for chills, fever, night sweats Cardiovascular: see above Dermatological: negative for rash Respiratory: negative for cough or wheezing Urologic: negative for hematuria Abdominal: negative for nausea, vomiting, diarrhea, bright red blood per rectum, melena, or hematemesis Neurologic: negative for visual changes, syncope, or dizziness All other systems reviewed and  are otherwise negative except as noted above.  Labs:  Lab Results  Component Value Date   WBC 8.6 08/21/2012   HGB 11.8* 08/21/2012   HCT 34.9* 08/21/2012   MCV 87.7 08/21/2012   PLT 371 08/21/2012    Recent Labs Lab 08/21/12 0935  NA 139  K 3.9  CL 102  CO2 27  BUN 14  CREATININE 0.81  CALCIUM 8.8  PROT 5.9*  BILITOT 0.2*  ALKPHOS 97  ALT 78*  AST 45*  GLUCOSE 95     Radiology/Studies:  Chest X-ray Pa And Lat  08/20/2012   *RADIOLOGY REPORT*  Clinical Data: Postpartum.  Shortness of breath.  Cough and chest tightness for several days  CHEST - 2 VIEW  Comparison: None.  Findings: Bibasilar alveolar infiltrates, right greater than left are identified. Bilateral pleural effusions, left greater than right are identified as is some fissural prominence and central peribronchial cuffing. Heart size is normal for a postpartum patient.  The appearance is suspicious for noncardiogenic pulmonary edema with basilar edema fluid. In the appropriate clinical setting, a right basilar pneumonia  is not excluded.  Follow-up to ensure resolution would be recommended post therapeutically.  Bony structures appear intact.  IMPRESSION: Findings suspicious for noncardiogenic pulmonary edema.  More focal infiltrate at the right base may be due to positional alveolar edema with pneumonia not completely excluded.   Original Report Authenticated By: Rhodia Albright, M.D.   Ct Angio Chest Pe W/cm &/or Wo Cm  08/20/2012   *RADIOLOGY REPORT*  Clinical Data: Worsening shortness of breath.  Chest discomfort. Status post vaginal delivery on 08/16/2012.  Treated for preeclampsia during labor.  Normal white blood cell count and no fever.  CT ANGIOGRAPHY CHEST  Technique:  Multidetector CT imaging of the chest using the standard protocol during bolus administration of intravenous contrast. Multiplanar reconstructed images including MIPs were obtained and reviewed to evaluate the vascular anatomy.  Contrast: OMNIPAQUE  IOHEXOL 350 MG/ML SOLN  Comparison: Chest radiographs obtained earlier today.  Findings: Normally opacified pulmonary arteries with no pulmonary arterial filling defects seen.  Patchy prominence of the interstitial markings in both lower lobes and in the right middle lobe.  There are also multiple patchy and confluent areas of airspace opacity in both lower lobes, right middle lobe and in the right upper lobe.  Small to moderate-sized bilateral pleural effusions.  Diffuse subcutaneous edema.  Small to moderate-sized hiatal hernia.  Normal sized heart.  Minimal thoracic spine degenerative changes.  Unremarkable upper abdomen.  IMPRESSION:  1.  No pulmonary emboli. 2.  Findings most compatible with congestive heart failure with interstitial and alveolar edema as well as bilateral pleural effusions and diffuse subcutaneous edema. 3.  Small to moderate-sized hiatal hernia.   Original Report Authenticated By: Beckie Salts, M.D.   EKG: sinus tach 111bpm, QTc 437, nonspecific T wave change III  Physical Exam: Blood pressure 162/87, pulse 119, temperature 97.8 F (36.6 C), temperature source Oral, resp. rate 30, height 5\' 5"  (1.651 m), weight 218 lb 0.9 oz (98.91 kg), last menstrual period 12/04/2011, SpO2 97.00%, currently breastfeeding. General: Well developed, well nourished AAF in no acute distress. Head: Normocephalic, atraumatic, sclera non-icteric, no xanthomas, nares are without discharge.  Neck: Negative for carotid bruits.  Lungs: Coarse BS at bases, otherwise without wheezes, rales, or rhonchi. Breathing is unlabored. Heart: Tachycardic, regular with S1 S2. No murmurs, rubs, or gallops appreciated. Abdomen: Soft, non-tender, with normoactive bowel sounds. No rebound/guarding.. Msk:  Strength and tone appear normal for age. Extremities: No clubbing or cyanosis. 1+ bilateral LE edema.  Distal pedal pulses are 2+ and equal bilaterally. Neuro: Alert and oriented X 3. No facial asymmetry. No focal  deficit. Moves all extremities spontaneously. Psych:  Responds to questions appropriately with a normal affect.   Assessment and Plan:   1. Post-partum pulmonary edema - EF 50%, possibly high output CHF in the setting of hormonal and fluid shifts related to pregnancy. Consider readdition of labetalol now that she is more compensated. Not clear that she requires ACEI at this point in time with low-normal EF given desire to breastfeed. Good response to Lasix thus far -- just received another 20mg  IV. Reassess in AM for further needs. Would aim for tight BP control. Discontinue hydralazine.  2. Chest pain/epigastric pain - now resolved. EKG without acute changes. Suspect due to volume overload. Check troponin for completeness. 3. VBAC 08/16/12 - per OB. 4. Hypertension - see above. TSH WNL. 5. Pre-eclampsia - complicated by proteinuria.  6. Hypoalbuminemia - likely related to #5. May be contributing to volume overload due to oncotic pressure. 7.  Mild transaminitis - may be related to passive hepatic congestion. Plts normal, LDH mildly elevated.  Signed, Dayna Dunn PA-C 08/21/2012, 12:51 PM As above, patient seen and examined. Briefly she is a 27 year old female who I am asked to evaluate for congestive heart failure. Patient did have preeclampsia with her first child 13 years ago. Her blood pressure typically is borderline but she is not on antihypertensives at home. She typically does not have dyspnea, chest pain or lower extremity edema. She was recently admitted at [redacted] weeks gestation for preeclampsia. Her blood pressure was elevated and labetalol was added. She ultimately delivered her second child and was discharged 08/18/2012. She did have lower extremity edema at the time of discharge but this slowly worsened. She also developed progressive dyspnea on exertion, orthopnea and PND. She had chest tightness associated with her dyspnea. She was admitted and a CT showed no pulmonary embolus but there were  findings compatible with congestive heart failure. Echocardiogram showed ejection fraction of 50% and mild mitral regurgitation. She has been placed on hydralazine and has also been diuresed with improvement in symptoms. Electrocardiogram shows sinus rhythm with no ST changes. Patient remains somewhat volume overloaded on examination. Would continue diuresis with Lasix 40 mg daily. Follow potassium and renal function. She also needs improved blood pressure control. Discontinue hydralazine. Resume labetalol 100 mg by mouth twice a day. We can increase this as needed. Hopefully the combination of beta-blockade and diuretics will improve her blood pressure. I do not think she will most likely require diuretics long-term. We will continue until her volume status improves. We will check one set of enzymes although I think ischemia is very unlikely. Olga Millers

## 2012-08-22 LAB — CBC
HCT: 35.8 % — ABNORMAL LOW (ref 36.0–46.0)
MCH: 28.6 pg (ref 26.0–34.0)
MCHC: 32.4 g/dL (ref 30.0–36.0)
MCV: 88.2 fL (ref 78.0–100.0)
RDW: 14.7 % (ref 11.5–15.5)

## 2012-08-22 LAB — COMPREHENSIVE METABOLIC PANEL
ALT: 65 U/L — ABNORMAL HIGH (ref 0–35)
AST: 37 U/L (ref 0–37)
Albumin: 2.3 g/dL — ABNORMAL LOW (ref 3.5–5.2)
Alkaline Phosphatase: 93 U/L (ref 39–117)
GFR calc Af Amer: >90 mL/min (ref >90)
Glucose, Bld: 109 mg/dL — ABNORMAL HIGH (ref 70–99)
Potassium: 4 mEq/L (ref 3.5–5.1)
Sodium: 136 mEq/L (ref 135–145)
Total Protein: 5.8 g/dL — ABNORMAL LOW (ref 6.0–8.3)

## 2012-08-22 MED ORDER — LABETALOL HCL 300 MG PO TABS
300.0000 mg | ORAL_TABLET | Freq: Two times a day (BID) | ORAL | Status: DC
  Administered 2012-08-22 (×2): 300 mg via ORAL
  Filled 2012-08-22 (×3): qty 1

## 2012-08-22 MED ORDER — POLYETHYLENE GLYCOL 3350 17 G PO PACK
17.0000 g | PACK | Freq: Every day | ORAL | Status: DC
  Administered 2012-08-22 – 2012-08-23 (×2): 17 g via ORAL
  Filled 2012-08-22 (×3): qty 1

## 2012-08-22 MED ORDER — FUROSEMIDE 40 MG PO TABS
40.0000 mg | ORAL_TABLET | Freq: Every day | ORAL | Status: DC
  Administered 2012-08-22 – 2012-08-23 (×2): 40 mg via ORAL
  Filled 2012-08-22 (×3): qty 1

## 2012-08-22 NOTE — Progress Notes (Signed)
HD #3 with pulmonary edema/ postpartum severe preeclampsia/ chronic hypertension/  acute diastolic dysfunction/  Subjective:  pt reports constipation. Headache improved. Less discomfort with breathing however still unable to breathe comfortable lying supine.   Objective: Vital signs in last 24 hours: Temp:  [98.3 F (36.8 C)-98.9 F (37.2 C)] 98.4 F (36.9 C) (08/02 0800) Pulse Rate:  [91-119] 98 (08/02 1000) Resp:  [14-30] 22 (08/02 1100) BP: (144-172)/(65-100) 151/65 mmHg (08/02 1100) SpO2:  [95 %-100 %] 97 % (08/02 1000) Weight:  [96.435 kg (212 lb 9.6 oz)] 96.435 kg (212 lb 9.6 oz) (08/02 0600) Last BM Date: 09/19/12  Intake/Output from previous day: 08/01 0701 - 08/02 0700 In: 2708.1 [P.O.:1650; I.V.:1058.1] Out: 6375 [Urine:6375] Intake/Output this shift: Total I/O In: 240.2 [I.V.:240.2] Out: 250 [Urine:250]  General appearance: alert and cooperative Resp: clear to auscultation bilaterally Cardio: regular rate and rhythm, S1, S2 normal, no murmur, click, rub or gallop GI: soft, non-tender; bowel sounds normal; no masses,  no organomegaly Extremities: edema 2+ EDEMA bilaterally slightly decreased from yestereday.  Neurologic: Alert and oriented X 3, normal strength and tone. Normal symmetric reflexes. Normal coordination and gait  Lab Results:   Recent Labs  08/21/12 0935 08/22/12 0548  WBC 8.6 8.4  HGB 11.8* 11.6*  HCT 34.9* 35.8*  PLT 371 442*   BMET  Recent Labs  08/21/12 0935 08/22/12 0548  NA 139 136  K 3.9 4.0  CL 102 102  CO2 27 26  GLUCOSE 95 109*  BUN 14 16  CREATININE 0.81 0.85  CALCIUM 8.8 8.4   PT/INR No results found for this basename: LABPROT, INR,  in the last 72 hours ABG No results found for this basename: PHART, PCO2, PO2, HCO3,  in the last 72 hours  Studies/Results: Dg Chest 2 View  08/21/2012   *RADIOLOGY REPORT*  Clinical Data: Persistent tachypnea  CHEST - 2 VIEW  Comparison: 08/20/2012.  Findings: The cardiac silhouette,  mediastinal and hilar contours are normal.  The lungs demonstrate improved aeration with clearing airspace process, likely edema.  Small effusions persist.  IMPRESSION: Improving lung aeration, likely improving edema. Persistent small effusions.   Original Report Authenticated By: Rudie Meyer, M.D.   Chest X-ray Pa And Lat  08/20/2012   *RADIOLOGY REPORT*  Clinical Data: Postpartum.  Shortness of breath.  Cough and chest tightness for several days  CHEST - 2 VIEW  Comparison: None.  Findings: Bibasilar alveolar infiltrates, right greater than left are identified. Bilateral pleural effusions, left greater than right are identified as is some fissural prominence and central peribronchial cuffing. Heart size is normal for a postpartum patient.  The appearance is suspicious for noncardiogenic pulmonary edema with basilar edema fluid. In the appropriate clinical setting, a right basilar pneumonia is not excluded.  Follow-up to ensure resolution would be recommended post therapeutically.  Bony structures appear intact.  IMPRESSION: Findings suspicious for noncardiogenic pulmonary edema.  More focal infiltrate at the right base may be due to positional alveolar edema with pneumonia not completely excluded.   Original Report Authenticated By: Rhodia Albright, M.D.   Ct Angio Chest Pe W/cm &/or Wo Cm  08/20/2012   *RADIOLOGY REPORT*  Clinical Data: Worsening shortness of breath.  Chest discomfort. Status post vaginal delivery on 08/16/2012.  Treated for preeclampsia during labor.  Normal white blood cell count and no fever.  CT ANGIOGRAPHY CHEST  Technique:  Multidetector CT imaging of the chest using the standard protocol during bolus administration of intravenous contrast. Multiplanar reconstructed images including  MIPs were obtained and reviewed to evaluate the vascular anatomy.  Contrast: OMNIPAQUE IOHEXOL 350 MG/ML SOLN  Comparison: Chest radiographs obtained earlier today.  Findings: Normally opacified  pulmonary arteries with no pulmonary arterial filling defects seen.  Patchy prominence of the interstitial markings in both lower lobes and in the right middle lobe.  There are also multiple patchy and confluent areas of airspace opacity in both lower lobes, right middle lobe and in the right upper lobe.  Small to moderate-sized bilateral pleural effusions.  Diffuse subcutaneous edema.  Small to moderate-sized hiatal hernia.  Normal sized heart.  Minimal thoracic spine degenerative changes.  Unremarkable upper abdomen.  IMPRESSION:  1.  No pulmonary emboli. 2.  Findings most compatible with congestive heart failure with interstitial and alveolar edema as well as bilateral pleural effusions and diffuse subcutaneous edema. 3.  Small to moderate-sized hiatal hernia.   Original Report Authenticated By: Beckie Salts, M.D.    Anti-infectives: Anti-infectives   None      Assessment/Plan: HD #3 with pulmonary edema/ chronic hypertension supreimposed postpartum severe preeclampsia. Acute diastolic dysfunction.  Appreciate cardiology and MFM consults.  Pulmonary Edema is improving however pt still has orthopnea.. Laxis 40 mg daily will monitor potassium level  Chronic htn .Marland Kitchen Labetalol increased to 300 mg bid.. Continue to increase as needed Severe preeclampsia. Plan to d/c magnesium sulfate this afternoon to complete 24 hours of therapy.  Constipation - miralax added.  Repeat PREE labs in am   LOS: 2 days    Kavin Weckwerth J. 08/22/2012

## 2012-08-22 NOTE — Progress Notes (Signed)
   Subjective:  Mild chest tightness; dyspnea improving   Objective:  Filed Vitals:   08/22/12 0314 08/22/12 0400 08/22/12 0500 08/22/12 0600  BP:  145/83 151/94 154/95  Pulse: 95 97 93 92  Temp:      TempSrc:      Resp: 20 18 20 23   Height:      Weight:    212 lb 9.6 oz (96.435 kg)  SpO2: 97% 98% 96% 98%    Intake/Output from previous day:  Intake/Output Summary (Last 24 hours) at 08/22/12 0714 Last data filed at 08/22/12 1610  Gross per 24 hour  Intake 2708.08 ml  Output   6375 ml  Net -3666.92 ml    Physical Exam: Physical exam: Well-developed well-nourished in no acute distress.  Skin is warm and dry.  HEENT is normal.  Neck is supple.  Chest is clear to auscultation with normal expansion.  Cardiovascular exam is regular rate and rhythm.  Abdominal exam nontender or distended. No masses palpated. Extremities show trace edema. neuro grossly intact    Lab Results: Basic Metabolic Panel:  Recent Labs  96/04/54 0935 08/22/12 0548  NA 139 136  K 3.9 4.0  CL 102 102  CO2 27 26  GLUCOSE 95 109*  BUN 14 16  CREATININE 0.81 0.85  CALCIUM 8.8 8.4  MG  --  4.4*   CBC:  Recent Labs  08/21/12 0935 08/22/12 0548  WBC 8.6 8.4  HGB 11.8* 11.6*  HCT 34.9* 35.8*  MCV 87.7 88.2  PLT 371 442*   Cardiac Enzymes:  Recent Labs  08/21/12 1527  TROPONINI <0.30     Assessment/Plan:  1 Acute diastolic dysfunction - improving; continue diuresis today; recheck BMET in AM.  2 HTN - change labetalol to 300 mg po BID; BP should improve with higher dose labetalol and diuretic. 3 Chest tightness-ECG normal and troponin neg; most likely related to volume excess.  If BP and volume status improves today, she could most likely be DCed in AM from a cardiac standpoint. Olga Millers 08/22/2012, 7:14 AM

## 2012-08-23 ENCOUNTER — Encounter (HOSPITAL_COMMUNITY)
Admission: RE | Admit: 2012-08-23 | Discharge: 2012-08-23 | Disposition: A | Discharge status: Home / Self Care | Payer: Commercial Managed Care - PPO | Source: Ambulatory Visit | Attending: Obstetrics and Gynecology | Admitting: Obstetrics and Gynecology

## 2012-08-23 DIAGNOSIS — O923 Agalactia: Secondary | ICD-10-CM | POA: Insufficient documentation

## 2012-08-23 LAB — COMPREHENSIVE METABOLIC PANEL
Alkaline Phosphatase: 92 U/L (ref 39–117)
BUN: 17 mg/dL (ref 6–23)
Chloride: 101 mEq/L (ref 96–112)
Creatinine, Ser: 0.75 mg/dL (ref 0.50–1.10)
GFR calc Af Amer: >90 mL/min (ref >90)
GFR calc non Af Amer: >90 mL/min (ref >90)
Glucose, Bld: 90 mg/dL (ref 70–99)
Potassium: 3.7 mEq/L (ref 3.5–5.1)
Total Bilirubin: 0.2 mg/dL — ABNORMAL LOW (ref 0.3–1.2)

## 2012-08-23 LAB — CBC
Platelets: 441 10*3/uL — ABNORMAL HIGH (ref 150–400)
RDW: 14.6 % (ref 11.5–15.5)
WBC: 7.6 10*3/uL (ref 4.0–10.5)

## 2012-08-23 MED ORDER — LABETALOL HCL 200 MG PO TABS
400.0000 mg | ORAL_TABLET | Freq: Two times a day (BID) | ORAL | Status: DC
  Administered 2012-08-23: 400 mg via ORAL
  Filled 2012-08-23 (×3): qty 2

## 2012-08-23 MED ORDER — FUROSEMIDE 40 MG PO TABS: 40.0000 mg | ORAL_TABLET | Freq: Every day | ORAL | Status: DC

## 2012-08-23 MED ORDER — LABETALOL HCL 200 MG PO TABS: 400.0000 mg | ORAL_TABLET | Freq: Two times a day (BID) | ORAL | Status: DC

## 2012-08-23 MED ORDER — POTASSIUM CHLORIDE CRYS ER 20 MEQ PO TBCR
20.0000 meq | EXTENDED_RELEASE_TABLET | Freq: Every day | ORAL | Status: DC
  Administered 2012-08-23: 20 meq via ORAL
  Filled 2012-08-23 (×2): qty 1

## 2012-08-23 MED ORDER — PANTOPRAZOLE SODIUM 40 MG IV SOLR
40.0000 mg | Freq: Once | INTRAVENOUS | Status: AC
  Administered 2012-08-23: 40 mg via INTRAVENOUS
  Filled 2012-08-23: qty 40

## 2012-08-23 MED ORDER — PANTOPRAZOLE SODIUM 20 MG PO TBEC: 20.0000 mg | DELAYED_RELEASE_TABLET | Freq: Every day | ORAL | Status: DC

## 2012-08-23 MED ORDER — POTASSIUM CHLORIDE CRYS ER 20 MEQ PO TBCR: 20.0000 meq | EXTENDED_RELEASE_TABLET | Freq: Every day | ORAL | Status: DC

## 2012-08-23 NOTE — Progress Notes (Signed)
HD #4 pulmonary edema/ CHTN with superimposed post partum severe preeclampsia and acute diastolic dysfunction  Subjective: Dyspnea has improved. Denies headache. Chest "tight".   Objective: Vital signs in last 24 hours: Temp:  [97.9 F (36.6 C)-98.5 F (36.9 C)] 98.5 F (36.9 C) (08/03 0845) Pulse Rate:  [83-103] 101 (08/03 0845) Resp:  [14-31] 26 (08/03 0845) BP: (137-165)/(65-124) 162/90 mmHg (08/03 0800) SpO2:  [96 %-100 %] 98 % (08/03 0845) Weight:  [96.798 kg (213 lb 6.4 oz)] 96.798 kg (213 lb 6.4 oz) (08/03 0600) Last BM Date: 08/23/12  Intake/Output from previous day: 08/02 0701 - 08/03 0700 In: 1699.8 [P.O.:1320; I.V.:379.8] Out: 2950 [Urine:2950] Intake/Output this shift: Total I/O In: 120 [P.O.:120] Out: 1200 [Urine:1200]  CV rrr Lungs clear to auscultation bilaterally  Abd soft uterus is firm  Ext 2+ edema.    Lab Results:   Recent Labs  08/22/12 0548 08/23/12 0522  WBC 8.4 7.6  HGB 11.6* 11.7*  HCT 35.8* 35.5*  PLT 442* 441*   BMET  Recent Labs  08/22/12 0548 08/23/12 0522  NA 136 135  K 4.0 3.7  CL 102 101  CO2 26 24  GLUCOSE 109* 90  BUN 16 17  CREATININE 0.85 0.75  CALCIUM 8.4 8.6   PT/INR No results found for this basename: LABPROT, INR,  in the last 72 hours ABG No results found for this basename: PHART, PCO2, PO2, HCO3,  in the last 72 hours  Studies/Results: Dg Chest 2 View  08/21/2012   *RADIOLOGY REPORT*  Clinical Data: Persistent tachypnea  CHEST - 2 VIEW  Comparison: 08/20/2012.  Findings: The cardiac silhouette, mediastinal and hilar contours are normal.  The lungs demonstrate improved aeration with clearing airspace process, likely edema.  Small effusions persist.  IMPRESSION: Improving lung aeration, likely improving edema. Persistent small effusions.   Original Report Authenticated By: Rudie Meyer, M.D.    Anti-infectives: Anti-infectives   None      Assessment/Plan: HD #4 HD #4 pulmonary edema/ CHTN with  superimposed post partum severe preeclampsia and acute diastolic dysfunction  Appreciate cardiology consult.  HTN still slightly elevated continue labetalol 400 bid  And lasix 40 mg qd outpatient along with Potassium 20 meq daily . Will continue to titrate labetalol on an outpatient basis.  Severe preeclampsia improving. lft's decreasing. GERD protonix outpatient.    LOS: 3 days    Tayshun Gappa J. 08/23/2012

## 2012-08-23 NOTE — Progress Notes (Signed)
Discharge instructions given - all questions answered. Pt ambulatory to main entrance.

## 2012-08-23 NOTE — Progress Notes (Signed)
   Subjective:  Indigestion last PM; dyspnea continues to improve   Objective:  Filed Vitals:   08/23/12 0500 08/23/12 0549 08/23/12 0600 08/23/12 0700  BP: 147/124 154/95 146/77 165/94  Pulse: 83 100 97   Temp:      TempSrc:      Resp: 23 22 31 24   Height:      Weight:   213 lb 6.4 oz (96.798 kg)   SpO2: 100% 99% 97%     Intake/Output from previous day:  Intake/Output Summary (Last 24 hours) at 08/23/12 0715 Last data filed at 08/23/12 0400  Gross per 24 hour  Intake 1699.84 ml  Output   2950 ml  Net -1250.16 ml    Physical Exam: Physical exam: Well-developed well-nourished in no acute distress.  Skin is warm and dry.  HEENT is normal.  Neck is supple.  Chest is clear to auscultation with normal expansion.  Cardiovascular exam is regular rate and rhythm.  Abdominal exam nontender or distended. No masses palpated. Extremities show trace-1+ edema. neuro grossly intact    Lab Results: Basic Metabolic Panel:  Recent Labs  81/19/14 0548 08/23/12 0522  NA 136 135  K 4.0 3.7  CL 102 101  CO2 26 24  GLUCOSE 109* 90  BUN 16 17  CREATININE 0.85 0.75  CALCIUM 8.4 8.6  MG 4.4*  --    CBC:  Recent Labs  08/22/12 0548 08/23/12 0522  WBC 8.4 7.6  HGB 11.6* 11.7*  HCT 35.8* 35.5*  MCV 88.2 89.0  PLT 442* 441*   Cardiac Enzymes:  Recent Labs  08/21/12 1527  TROPONINI <0.30     Assessment/Plan:  1 Acute diastolic dysfunction - improving; continue diuresis with lasix 40 mg daily; add KCL 20 meq po daily. Doubt she will need diuretic long term but will reassess in office. Patient can be DCed from cardiac standpoint on present meds and FU in 1 week with me. Check BMET 3 days after DC. 2 HTN - change labetalol to 400 mg po BID; will titrate further as outpatient as needed. 3 Chest tightness-ECG normal and troponin neg; most likely related to volume excess. We will sign off. Please call with questions. Olga Millers 08/23/2012, 7:15 AM

## 2012-08-23 NOTE — Discharge Summary (Signed)
Physician Discharge Summary  Patient ID: Savannah Cole MRN: [id number] DOB/AGE: 1985-02-14 27 y.o.  Admit date: 08/20/2012 Discharge date: 08/23/2012  Admission Diagnoses:1. Pulmonary Edema 2. Chronic hypertention 3. Severe preeclampsia   Discharge Diagnoses:  Active Problems:   Chronic hypertension with superimposed preeclampsia   Acute diastolic heart failure   Elevated LFTs   Discharged Condition: stable  Hospital Course: Pt was admitted on 09/20/2012 with tachypnea and tachycardia. She was diagnosed with pulmonary edema. Chronic hypertension with superimposed severe preeclampsia. She was ruled out for postpartum cardiomyopathy and pulmonary embolism. She had a negative spiral ct scan. Cardiology consult was obtained. She was diagnosed with acute diastolic dysfunction. She received IV lasix and labetalol inpatient as well as 24 hours of magnesium sulfate. With significant diuresis and resolution of pulmonary edema. She is discharged on lasix 40 mg po daily labetalol 400 mg bid and kcl 20 meq daily.    Consults: cardiology and maternal fetal medicine   Significant Diagnostic Studies: labs: K 3.7   Treatments: iv lasix./ magnesium sulfate iv for 24 hours/ htn managemetn   Discharge Exam: Blood pressure 162/90, pulse 101, temperature 98.5 F (36.9 C), temperature source Oral, resp. rate 26, height 5\' 5"  (1.651 m), weight 96.798 kg (213 lb 6.4 oz), last menstrual period 12/04/2011, SpO2 98.00%, currently breastfeeding.   Disposition: 01-Home or Self Care     Medication List         alum & mag hydroxide-simeth 200-200-20 MG/5ML suspension  Commonly known as:  MAALOX/MYLANTA  Take 10 mLs by mouth every 6 (six) hours as needed for indigestion.     furosemide 40 MG tablet  Commonly known as:  LASIX  Take 1 tablet (40 mg total) by mouth daily.     ibuprofen 600 MG tablet  Commonly known as:  ADVIL,MOTRIN  Take 1 tablet (600 mg total) by mouth every 6 (six) hours.     labetalol 200 MG tablet  Commonly known as:  NORMODYNE  Take 2 tablets (400 mg total) by mouth 2 (two) times daily.     pantoprazole 20 MG tablet  Commonly known as:  PROTONIX  Take 1 tablet (20 mg total) by mouth daily.     potassium chloride SA 20 MEQ tablet  Commonly known as:  K-DUR,KLOR-CON  Take 1 tablet (20 mEq total) by mouth daily.     prenatal multivitamin Tabs  Take 1 tablet by mouth daily at 12 noon.     ranitidine 150 MG capsule  Commonly known as:  ZANTAC  Take 150 mg by mouth daily as needed for heartburn.           Follow-up Information   Follow up with Geryl Rankins, MD. Schedule an appointment as soon as possible for a visit on 08/26/2012. ( appt with Dr. Dion Body and check basic metabolic panel )    Contact information:   [address], [address] [place] [address] [phone]       Signed: Diar Berkel J. 08/23/2012, 11:08 AM

## 2012-08-24 NOTE — Progress Notes (Signed)
Post discharge chart review completed.  

## 2012-09-02 ENCOUNTER — Telehealth: Payer: Self-pay | Admitting: Physician Assistant

## 2012-09-02 NOTE — Telephone Encounter (Signed)
Pt called because she has been taking Lasix 40 mg one tablet by mouth daily , and now her swelling is gone. According to pt  this medication was prescribed for pulmonary edema when she was in the hospital in 08/20/12;  this medication makes pt go to the restroom frequently and she does not have any urine left  in her bladder, and she has like bladder cramps. Pt states she is going to stop taking this medication. Pt has not been  in this office before . Pt has an appointment with Tereso Newcomer PA on 09/18/12 at 11:30 AM. Pt was made aware that because she has never been seen in this office  a MD can't decide for her that is fine to stop furosemide. Pt was advised to call her OB GYN and let him/her know about her symptoms and if she can stop Lasix. Pt verbalized understanding.

## 2012-09-02 NOTE — Telephone Encounter (Signed)
New Problem  Pt has an inquiry about RX; Lasix.. Wants to know if she should continue to take it. Please advise

## 2012-09-18 ENCOUNTER — Ambulatory Visit (INDEPENDENT_AMBULATORY_CARE_PROVIDER_SITE_OTHER): Payer: Commercial Managed Care - PPO | Admitting: Physician Assistant

## 2012-09-18 ENCOUNTER — Encounter: Payer: Self-pay | Admitting: Physician Assistant

## 2012-09-18 VITALS — BP 137/79 | HR 94 | Ht 65.0 in | Wt 186.0 lb

## 2012-09-18 DIAGNOSIS — R1013 Epigastric pain: Secondary | ICD-10-CM

## 2012-09-18 DIAGNOSIS — K3189 Other diseases of stomach and duodenum: Secondary | ICD-10-CM

## 2012-09-18 DIAGNOSIS — I5032 Chronic diastolic (congestive) heart failure: Secondary | ICD-10-CM

## 2012-09-18 DIAGNOSIS — I10 Essential (primary) hypertension: Secondary | ICD-10-CM

## 2012-09-18 NOTE — Progress Notes (Signed)
[address], [address] [place], [place]  [address] Phone: [phone] Fax:  [phone]  Date:  09/18/2012   ID:  Savannah Cole, DOB [birth year], MRN [id number]  PCP:  Loreen Freud, DO  Cardiologist:  Dr. Olga Millers     History of Present Illness: Savannah Cole is a 27 y.o. female who returns for follow up after recent admission to the hospital for acute diastolic CHF.  She has a history of preeclampsia and hypertension. She presented back to the hospital 4 days postpartum with complaints of dyspnea, chest tightness and LE edema.  Chest CT was negative for pulmonary embolism but findings were compatible with CHF.  Echocardiogram 08/21/12: Mild LVH, EF 50%, normal wall motion, mild MR, PASP 34.  She was seen by cardiology. She was diuresed with Lasix and placed on labetalol for blood pressure control. Cardiac markers were normal. It was doubtful she would need long-term diuretic. It was felt her chest discomfort was likely related to volume overload.  Since discharge, she is doing well. She gets occasional chest discomfort with meals. She notes associated belching. She denies exertional chest pain. She denies exertional shortness of breath. She denies orthopnea, PND or edema. She denies syncope. She stopped Lasix on her own 2 weeks ago. She denies any increased edema or shortness of breath since then. Weights have been consistently decreasing.  Labs (8/14):   K 3.7, creatinine 0.75, ALT 54, Hgb 11.7, TSH 0.964  Wt Readings from Last 3 Encounters:  09/18/12 186 lb (84.369 kg)  08/23/12 213 lb 6.4 oz (96.798 kg)  08/15/12 228 lb 3.2 oz (103.511 kg)     Past Medical History  Diagnosis Date  . Anxiety   . Gonorrhea   . Chlamydia   . Hypertension   . Preeclampsia     With first pregnancy and 07/2012 pregnancy.  . Gestational diabetes     With first pregnancy  . Hiatal hernia     a. Small-mod hiatal hernia seen on CT 07/2012.  . Pulmonary edema     a. Post-partum  CHF 07/2012.  Marland Kitchen Chronic diastolic heart failure     postpartum; preeclampsia;  Echocardiogram 08/21/12: Mild LVH, EF 50%, normal wall motion, mild MR, PASP 34.    Current Outpatient Prescriptions  Medication Sig Dispense Refill  . ibuprofen (ADVIL,MOTRIN) 600 MG tablet Take 1 tablet (600 mg total) by mouth every 6 (six) hours.  30 tablet  0  . labetalol (NORMODYNE) 200 MG tablet Take 2 tablets (400 mg total) by mouth 2 (two) times daily.  60 tablet  1  . OVER THE COUNTER MEDICATION ORAL STOOL SOFTNER AS NEEDED      . Prenatal Vit-Fe Fumarate-FA (PRENATAL MULTIVITAMIN) TABS Take 1 tablet by mouth daily at 12 noon.       No current facility-administered medications for this visit.    Allergies:   No Known Allergies  Social History:  The patient  reports that she has never smoked. She does not have any smokeless tobacco history on file. She reports that she does not drink alcohol or use illicit drugs.   ROS:  Please see the history of present illness.      All other systems reviewed and negative.   PHYSICAL EXAM: VS:  BP 137/79  Pulse 94  Ht 5\' 5"  (1.651 m)  Wt 186 lb (84.369 kg)  BMI 30.95 kg/m2 Well nourished, well developed, in no acute distress HEENT: normal Neck: no JVD Cardiac:  normal S1, S2;  RRR; no murmur Lungs:  clear to auscultation bilaterally, no wheezing, rhonchi or rales Abd: soft, nontender, no hepatomegaly Ext: no edema Skin: warm and dry Neuro:  CNs 2-12 intact, no focal abnormalities noted  EKG:  NSR, HR 94, normal axis, no acute changes     ASSESSMENT AND PLAN:  1. Diastolic CHF:  This was in the setting of post-partum state, pre-eclampsia and uncontrolled HTN.  Volume is stable off diuretics.  She had a BMET drawn with her OB after d/c.  She tells me this was normal.  Recommend good BP control.  She would likely be high risk for recurrence with subsequent pregnancies.   2. Hypertension:  Controlled.  Continue current therapy.  3. Dyspepsia:  She should d/w  her OB and pediatrician what antacids are safe for her to take while breast feeding.  4. Disposition:  F/u with Dr. Olga Millers in 3 mos.   Signed, Tereso Newcomer, PA-C  09/18/2012 12:01 PM

## 2012-09-18 NOTE — Patient Instructions (Addendum)
Your physician recommends that you continue on your current medications as directed. Please refer to the Current Medication list given to you today.  Your physician recommends that you schedule a follow-up appointment in: 3 months with Dr.Crenshaw  

## 2012-12-15 ENCOUNTER — Encounter: Payer: Commercial Managed Care - PPO | Admitting: Cardiology

## 2012-12-15 NOTE — Progress Notes (Signed)
      HPI: FU hypertension and diastolic CHF. She has a history of preeclampsia. Admitted 8/14 4 days postpartum with dyspnea and edema. Chest CT was negative for pulmonary embolism but findings were compatible with CHF. Echocardiogram 08/21/12: Mild LVH, EF 50%, normal wall motion, mild MR, PASP 34. She was diuresed with Lasix and placed on labetalol for blood pressure control. Cardiac markers were normal. Patient improved and ultimately DCed her lasix. Since she was last seen,     Current Outpatient Prescriptions  Medication Sig Dispense Refill  . ibuprofen (ADVIL,MOTRIN) 600 MG tablet Take 1 tablet (600 mg total) by mouth every 6 (six) hours.  30 tablet  0  . labetalol (NORMODYNE) 200 MG tablet Take 2 tablets (400 mg total) by mouth 2 (two) times daily.  60 tablet  1  . OVER THE COUNTER MEDICATION ORAL STOOL SOFTNER AS NEEDED      . Prenatal Vit-Fe Fumarate-FA (PRENATAL MULTIVITAMIN) TABS Take 1 tablet by mouth daily at 12 noon.       No current facility-administered medications for this visit.     Past Medical History  Diagnosis Date  . Anxiety   . Gonorrhea   . Chlamydia   . Hypertension   . Preeclampsia     With first pregnancy and 07/2012 pregnancy.  . Gestational diabetes     With first pregnancy  . Hiatal hernia     a. Small-mod hiatal hernia seen on CT 07/2012.  . Pulmonary edema     a. Post-partum CHF 07/2012.  Marland Kitchen Chronic diastolic heart failure     postpartum; preeclampsia;  Echocardiogram 08/21/12: Mild LVH, EF 50%, normal wall motion, mild MR, PASP 34.    Past Surgical History  Procedure Laterality Date  . Cesarean section    . Dilation and curettage of uterus      due to miscarriage    History   Social History  . Marital Status: Divorced    Spouse Name: N/A    Number of Children: N/A  . Years of Education: N/A   Occupational History  . Not on file.   Social History Main Topics  . Smoking status: Never Smoker   . Smokeless tobacco: Not on file  .  Alcohol Use: No     Comment: Not during pregnancy, otherwise rare on weekends  . Drug Use: No  . Sexual Activity: Yes   Other Topics Concern  . Not on file   Social History Narrative  . No narrative on file    ROS: no fevers or chills, productive cough, hemoptysis, dysphasia, odynophagia, melena, hematochezia, dysuria, hematuria, rash, seizure activity, orthopnea, PND, pedal edema, claudication. Remaining systems are negative.  Physical Exam: Well-developed well-nourished in no acute distress.  Skin is warm and dry.  HEENT is normal.  Neck is supple.  Chest is clear to auscultation with normal expansion.  Cardiovascular exam is regular rate and rhythm.  Abdominal exam nontender or distended. No masses palpated. Extremities show no edema. neuro grossly intact  ECG     This encounter was created in error - please disregard.

## 2012-12-25 ENCOUNTER — Encounter: Payer: Self-pay | Admitting: Cardiology

## 2013-03-12 ENCOUNTER — Encounter: Payer: Self-pay | Admitting: Cardiology

## 2013-11-22 ENCOUNTER — Encounter: Payer: Self-pay | Admitting: Physician Assistant

## 2013-12-08 ENCOUNTER — Encounter (HOSPITAL_COMMUNITY): Payer: Self-pay | Admitting: *Deleted

## 2013-12-08 ENCOUNTER — Emergency Department (HOSPITAL_COMMUNITY): Payer: Commercial Managed Care - PPO

## 2013-12-08 ENCOUNTER — Inpatient Hospital Stay (HOSPITAL_COMMUNITY)
Admission: AD | Admit: 2013-12-08 | Payer: Commercial Managed Care - PPO | Source: Ambulatory Visit | Admitting: Obstetrics and Gynecology

## 2013-12-08 ENCOUNTER — Emergency Department (HOSPITAL_COMMUNITY)
Admission: EM | Admit: 2013-12-08 | Discharge: 2013-12-09 | Disposition: A | Discharge status: Home / Self Care | Payer: Commercial Managed Care - PPO | Attending: Emergency Medicine | Admitting: Emergency Medicine

## 2013-12-08 DIAGNOSIS — Z8709 Personal history of other diseases of the respiratory system: Secondary | ICD-10-CM | POA: Diagnosis not present

## 2013-12-08 DIAGNOSIS — R079 Chest pain, unspecified: Secondary | ICD-10-CM | POA: Insufficient documentation

## 2013-12-08 DIAGNOSIS — Z8619 Personal history of other infectious and parasitic diseases: Secondary | ICD-10-CM | POA: Insufficient documentation

## 2013-12-08 DIAGNOSIS — Z79899 Other long term (current) drug therapy: Secondary | ICD-10-CM | POA: Insufficient documentation

## 2013-12-08 DIAGNOSIS — I10 Essential (primary) hypertension: Secondary | ICD-10-CM | POA: Diagnosis not present

## 2013-12-08 DIAGNOSIS — R109 Unspecified abdominal pain: Secondary | ICD-10-CM | POA: Insufficient documentation

## 2013-12-08 DIAGNOSIS — Z8659 Personal history of other mental and behavioral disorders: Secondary | ICD-10-CM | POA: Diagnosis not present

## 2013-12-08 DIAGNOSIS — Z9889 Other specified postprocedural states: Secondary | ICD-10-CM | POA: Insufficient documentation

## 2013-12-08 DIAGNOSIS — Z8632 Personal history of gestational diabetes: Secondary | ICD-10-CM | POA: Insufficient documentation

## 2013-12-08 DIAGNOSIS — I5032 Chronic diastolic (congestive) heart failure: Secondary | ICD-10-CM | POA: Diagnosis not present

## 2013-12-08 DIAGNOSIS — Z8719 Personal history of other diseases of the digestive system: Secondary | ICD-10-CM | POA: Diagnosis not present

## 2013-12-08 LAB — CBC
HEMATOCRIT: 39.2 % (ref 36.0–46.0)
Hemoglobin: 13.3 g/dL (ref 12.0–15.0)
MCH: 30.2 pg (ref 26.0–34.0)
MCHC: 33.9 g/dL (ref 30.0–36.0)
MCV: 89.1 fL (ref 78.0–100.0)
PLATELETS: 358 10*3/uL (ref 150–400)
RBC: 4.4 MIL/uL (ref 3.87–5.11)
RDW: 12.8 % (ref 11.5–15.5)
WBC: 8.3 10*3/uL (ref 4.0–10.5)

## 2013-12-08 LAB — I-STAT TROPONIN, ED: Troponin i, poc: 0 ng/mL (ref 0.00–0.08)

## 2013-12-08 LAB — POC URINE PREG, ED: PREG TEST UR: NEGATIVE

## 2013-12-08 LAB — BASIC METABOLIC PANEL
ANION GAP: 11 (ref 5–15)
BUN: 18 mg/dL (ref 6–23)
CALCIUM: 10.1 mg/dL (ref 8.4–10.5)
CO2: 25 mEq/L (ref 19–32)
CREATININE: 0.77 mg/dL (ref 0.50–1.10)
Chloride: 99 mEq/L (ref 96–112)
GFR calc non Af Amer: >90 mL/min (ref >90)
Glucose, Bld: 99 mg/dL (ref 70–99)
Potassium: 4.2 mEq/L (ref 3.7–5.3)
Sodium: 135 mEq/L — ABNORMAL LOW (ref 137–147)

## 2013-12-08 LAB — LIPASE, BLOOD: LIPASE: 42 U/L (ref 11–59)

## 2013-12-08 MED ORDER — KETOROLAC TROMETHAMINE 30 MG/ML IJ SOLN
30.0000 mg | Freq: Once | INTRAMUSCULAR | Status: AC
Start: 2013-12-08 — End: 2013-12-09
  Administered 2013-12-09: 30 mg via INTRAVENOUS
  Filled 2013-12-08: qty 1

## 2013-12-08 NOTE — ED Notes (Signed)
Pt states that she has been seen numerous times for URI / swollen glands in throat; pt states that she has 1 day left for a Zpack for URI / Sinusitis; pt states that she has generalized weakness, abd pain from a hernia that she is to see the General Surgeon for 11/30; pt states that today she has had chest pain off and on for 1 week; pt states that the chest pain has been more consistent today; pt c/o chest sharp midsternal pains; pt c/o palpitations; pt states that she has not felt well in months and is unsure what is going on

## 2013-12-09 ENCOUNTER — Other Ambulatory Visit: Payer: Self-pay | Admitting: Obstetrics and Gynecology

## 2013-12-09 DIAGNOSIS — K439 Ventral hernia without obstruction or gangrene: Secondary | ICD-10-CM

## 2013-12-09 LAB — URINALYSIS, ROUTINE W REFLEX MICROSCOPIC
Bilirubin Urine: NEGATIVE
GLUCOSE, UA: NEGATIVE mg/dL
HGB URINE DIPSTICK: NEGATIVE
KETONES UR: NEGATIVE mg/dL
LEUKOCYTES UA: NEGATIVE
Nitrite: NEGATIVE
PH: 7 (ref 5.0–8.0)
PROTEIN: NEGATIVE mg/dL
Specific Gravity, Urine: 1.023 (ref 1.005–1.030)
Urobilinogen, UA: 0.2 mg/dL (ref 0.0–1.0)

## 2013-12-09 LAB — D-DIMER, QUANTITATIVE: D-Dimer, Quant: <0.27 ug/mL-FEU (ref 0.00–0.48)

## 2013-12-10 ENCOUNTER — Emergency Department (HOSPITAL_COMMUNITY): Payer: Commercial Managed Care - PPO

## 2013-12-10 ENCOUNTER — Emergency Department (HOSPITAL_COMMUNITY)
Admission: EM | Admit: 2013-12-10 | Discharge: 2013-12-10 | Disposition: A | Discharge status: Home / Self Care | Payer: Commercial Managed Care - PPO | Attending: Emergency Medicine | Admitting: Emergency Medicine

## 2013-12-10 ENCOUNTER — Encounter (HOSPITAL_COMMUNITY): Payer: Self-pay | Admitting: Emergency Medicine

## 2013-12-10 DIAGNOSIS — Z8709 Personal history of other diseases of the respiratory system: Secondary | ICD-10-CM | POA: Insufficient documentation

## 2013-12-10 DIAGNOSIS — I10 Essential (primary) hypertension: Secondary | ICD-10-CM | POA: Insufficient documentation

## 2013-12-10 DIAGNOSIS — Z79899 Other long term (current) drug therapy: Secondary | ICD-10-CM | POA: Diagnosis not present

## 2013-12-10 DIAGNOSIS — Z8659 Personal history of other mental and behavioral disorders: Secondary | ICD-10-CM | POA: Diagnosis not present

## 2013-12-10 DIAGNOSIS — K5900 Constipation, unspecified: Secondary | ICD-10-CM | POA: Insufficient documentation

## 2013-12-10 DIAGNOSIS — I5032 Chronic diastolic (congestive) heart failure: Secondary | ICD-10-CM | POA: Insufficient documentation

## 2013-12-10 DIAGNOSIS — Z8719 Personal history of other diseases of the digestive system: Secondary | ICD-10-CM | POA: Diagnosis not present

## 2013-12-10 DIAGNOSIS — Z8619 Personal history of other infectious and parasitic diseases: Secondary | ICD-10-CM | POA: Insufficient documentation

## 2013-12-10 DIAGNOSIS — R109 Unspecified abdominal pain: Secondary | ICD-10-CM | POA: Diagnosis present

## 2013-12-10 MED ORDER — ONDANSETRON 4 MG PO TBDP
4.0000 mg | ORAL_TABLET | Freq: Once | ORAL | Status: AC
  Administered 2013-12-10: 4 mg via ORAL
  Filled 2013-12-10: qty 1

## 2013-12-10 MED ORDER — IBUPROFEN 800 MG PO TABS
800.0000 mg | ORAL_TABLET | Freq: Once | ORAL | Status: AC
  Administered 2013-12-10: 800 mg via ORAL
  Filled 2013-12-10: qty 1

## 2013-12-10 MED ORDER — IBUPROFEN 800 MG PO TABS: 800.0000 mg | ORAL_TABLET | Freq: Three times a day (TID) | ORAL | Status: DC | PRN

## 2013-12-10 MED ORDER — ONDANSETRON 4 MG PO TBDP: 4.0000 mg | ORAL_TABLET | Freq: Three times a day (TID) | ORAL | Status: DC | PRN

## 2013-12-10 NOTE — Discharge Instructions (Signed)
You may use over-the-counter Metamucil once a day, Miralax once a day, Colace tablets twice a day to help with bowel movements. You may take these medications together every day. Please hold these medications if you have diarrhea. If you're still not having a bowel movement you may use magnesium citrate, Fleet enemas, Dulcolax suppositories as needed.    Constipation Constipation is when a person has fewer than three bowel movements a week, has difficulty having a bowel movement, or has stools that are dry, hard, or larger than normal. As people grow older, constipation is more common. If you try to fix constipation with medicines that make you have a bowel movement (laxatives), the problem may get worse. Long-term laxative use may cause the muscles of the colon to become weak. A low-fiber diet, not taking in enough fluids, and taking certain medicines may make constipation worse.  CAUSES   Certain medicines, such as antidepressants, pain medicine, iron supplements, antacids, and water pills.   Certain diseases, such as diabetes, irritable bowel syndrome (IBS), thyroid disease, or depression.   Not drinking enough water.   Not eating enough fiber-rich foods.   Stress or travel.   Lack of physical activity or exercise.   Ignoring the urge to have a bowel movement.   Using laxatives too much.  SIGNS AND SYMPTOMS   Having fewer than three bowel movements a week.   Straining to have a bowel movement.   Having stools that are hard, dry, or larger than normal.   Feeling full or bloated.   Pain in the lower abdomen.   Not feeling relief after having a bowel movement.  DIAGNOSIS  Your health care provider will take a medical history and perform a physical exam. Further testing may be done for severe constipation. Some tests may include:  A barium enema X-ray to examine your rectum, colon, and, sometimes, your small intestine.   A sigmoidoscopy to examine your lower  colon.   A colonoscopy to examine your entire colon. TREATMENT  Treatment will depend on the severity of your constipation and what is causing it. Some dietary treatments include drinking more fluids and eating more fiber-rich foods. Lifestyle treatments may include regular exercise. If these diet and lifestyle recommendations do not help, your health care provider may recommend taking over-the-counter laxative medicines to help you have bowel movements. Prescription medicines may be prescribed if over-the-counter medicines do not work.  HOME CARE INSTRUCTIONS   Eat foods that have a lot of fiber, such as fruits, vegetables, whole grains, and beans.  Limit foods high in fat and processed sugars, such as french fries, hamburgers, cookies, candies, and soda.   A fiber supplement may be added to your diet if you cannot get enough fiber from foods.   Drink enough fluids to keep your urine clear or pale yellow.   Exercise regularly or as directed by your health care provider.   Go to the restroom when you have the urge to go. Do not hold it.   Only take over-the-counter or prescription medicines as directed by your health care provider. Do not take other medicines for constipation without talking to your health care provider first.  SEEK IMMEDIATE MEDICAL CARE IF:   You have bright red blood in your stool.   Your constipation lasts for more than 4 days or gets worse.   You have abdominal or rectal pain.   You have thin, pencil-like stools.   You have unexplained weight loss. MAKE SURE YOU:  Understand these instructions.  Will watch your condition.  Will get help right away if you are not doing well or get worse. Document Released: 10/06/2003 Document Revised: 01/12/2013 Document Reviewed: 10/19/2012 [facility] Patient Information 2015 [employer], [place]. This information is not intended to replace advice given to you by your health care provider. Make sure you discuss any  questions you have with your health care provider.   High-Fiber Diet Fiber is found in fruits, vegetables, and grains. A high-fiber diet encourages the addition of more whole grains, legumes, fruits, and vegetables in your diet. The recommended amount of fiber for adult males is 38 g per day. For adult females, it is 25 g per day. Pregnant and lactating women should get 28 g of fiber per day. If you have a digestive or bowel problem, ask your caregiver for advice before adding high-fiber foods to your diet. Eat a variety of high-fiber foods instead of only a select few type of foods.  PURPOSE  To increase stool bulk.  To make bowel movements more regular to prevent constipation.  To lower cholesterol.  To prevent overeating. WHEN IS THIS DIET USED?  It may be used if you have constipation and hemorrhoids.  It may be used if you have uncomplicated diverticulosis (intestine condition) and irritable bowel syndrome.  It may be used if you need help with weight management.  It may be used if you want to add it to your diet as a protective measure against atherosclerosis, diabetes, and cancer. SOURCES OF FIBER  Whole-grain breads and cereals.  Fruits, such as apples, oranges, bananas, berries, prunes, and pears.  Vegetables, such as green peas, carrots, sweet potatoes, beets, broccoli, cabbage, spinach, and artichokes.  Legumes, such split peas, soy, lentils.  Almonds. FIBER CONTENT IN FOODS Starches and Grains / Dietary Fiber (g)  Cheerios, 1 cup / 3 g  Corn Flakes cereal, 1 cup / 0.7 g  Rice crispy treat cereal, 1 cup / 0.3 g  Instant oatmeal (cooked),  cup / 2 g  Frosted wheat cereal, 1 cup / 5.1 g  Brown, long-grain rice (cooked), 1 cup / 3.5 g  White, long-grain rice (cooked), 1 cup / 0.6 g  Enriched macaroni (cooked), 1 cup / 2.5 g Legumes / Dietary Fiber (g)  Baked beans (canned, plain, or vegetarian),  cup / 5.2 g  Kidney beans (canned),  cup / 6.8  g  Pinto beans (cooked),  cup / 5.5 g Breads and Crackers / Dietary Fiber (g)  Plain or honey graham crackers, 2 squares / 0.7 g  Saltine crackers, 3 squares / 0.3 g  Plain, salted pretzels, 10 pieces / 1.8 g  Whole-wheat bread, 1 slice / 1.9 g  White bread, 1 slice / 0.7 g  Raisin bread, 1 slice / 1.2 g  Plain bagel, 3 oz / 2 g  Flour tortilla, 1 oz / 0.9 g  Corn tortilla, 1 small / 1.5 g  Hamburger or hotdog bun, 1 small / 0.9 g Fruits / Dietary Fiber (g)  Apple with skin, 1 medium / 4.4 g  Sweetened applesauce,  cup / 1.5 g  Banana,  medium / 1.5 g  Grapes, 10 grapes / 0.4 g  Orange, 1 small / 2.3 g  Raisin, 1.5 oz / 1.6 g  Melon, 1 cup / 1.4 g Vegetables / Dietary Fiber (g)  Green beans (canned),  cup / 1.3 g  Carrots (cooked),  cup / 2.3 g  Broccoli (cooked),  cup / 2.8  g  Peas (cooked),  cup / 4.4 g  Mashed potatoes,  cup / 1.6 g  Lettuce, 1 cup / 0.5 g  Corn (canned),  cup / 1.6 g  Tomato,  cup / 1.1 g Document Released: 01/07/2005 Document Revised: 07/09/2011 Document Reviewed: 04/11/2011 [facility] Patient Information 2015 [place], [place]. This information is not intended to replace advice given to you by your health care provider. Make sure you discuss any questions you have with your health care provider.

## 2013-12-10 NOTE — ED Notes (Signed)
Pt reports abd pain and bloating for past several days, was diagnosed with hernia, but has not been able to get appointment for repair. Also reports she has been constipated for the past 10 days. No emesis, but does have nausea. Called GI and was told to come here. Pt able to pass gas.

## 2013-12-10 NOTE — ED Provider Notes (Signed)
CSN: [id number]     Arrival date & time 12/08/13  2103 History   First MD Initiated Contact with Patient 12/08/13 2304     Chief Complaint  Patient presents with  . Abdominal Pain  . Chest Pain      HPI Patient states she's had chest discomfort off and on over the past week and that today her pain is been more constant.  She reports it as a pressure sensation in her mid sternum.  She has no radiation of her symptoms.  If fevers or chills.  Denies productive cough.  She has had some nasal congestion.  She denies unilateral leg swelling.  There is a family history of venous thromboembolic disease.  She denies melena or hematochezia.  No recent injury or trauma.  No fevers or chills.  Denies back pain.  Symptoms are mild to moderate in severity.   Past Medical History  Diagnosis Date  . Anxiety   . Gonorrhea   . Chlamydia   . Hypertension   . Preeclampsia     With first pregnancy and 07/2012 pregnancy.  . Gestational diabetes     With first pregnancy  . Hiatal hernia     a. Small-mod hiatal hernia seen on CT 07/2012.  . Pulmonary edema     a. Post-partum CHF 07/2012.  Marland Kitchen. Chronic diastolic heart failure     postpartum; preeclampsia;  Echocardiogram 08/21/12: Mild LVH, EF 50%, normal wall motion, mild MR, PASP 34.   Past Surgical History  Procedure Laterality Date  . Cesarean section    . Dilation and curettage of uterus      due to miscarriage   Family History  Problem Relation Age of Onset  . Hypertension Father   . Diabetes Father   . Hypertension Maternal Grandmother   . Diabetes Maternal Grandmother    History  Substance Use Topics  . Smoking status: Never Smoker   . Smokeless tobacco: Not on file  . Alcohol Use: No     Comment: Not during pregnancy, otherwise rare on weekends   OB History    Gravida Para Term Preterm AB TAB SAB Ectopic Multiple Living   6 2 1 1 4  3   2      Review of Systems  All other systems reviewed and are negative.     Allergies  Review  of patient's allergies indicates no known allergies.  Home Medications   Prior to Admission medications   Medication Sig Start Date End Date Taking? Authorizing Provider  bisacodyl (DULCOLAX) 10 MG suppository Place 10 mg rectally as needed for mild constipation or moderate constipation.   Yes Historical Provider, MD  fluconazole (DIFLUCAN) 150 MG tablet Take 150 mg by mouth daily.   Yes Historical Provider, MD  ibuprofen (ADVIL,MOTRIN) 600 MG tablet Take 1 tablet (600 mg total) by mouth every 6 (six) hours. Patient taking differently: Take 600 mg by mouth every 6 (six) hours as needed for moderate pain.  08/18/12  Yes Geryl RankinsEvelyn Varnado, MD  polyethylene glycol (MIRALAX / GLYCOLAX) packet Take 17 g by mouth daily as needed for mild constipation.   Yes Historical Provider, MD  Sodium Phosphates (ENEMA RE) Place 1 each rectally once.   Yes Historical Provider, MD  labetalol (NORMODYNE) 200 MG tablet Take 2 tablets (400 mg total) by mouth 2 (two) times daily. Patient not taking: Reported on 12/08/2013 08/23/12   Dorien Chihuahuaara J. Richardson Doppole, MD  OVER THE COUNTER MEDICATION ORAL STOOL SOFTNER AS NEEDED  Historical Provider, MD   BP 113/59 mmHg  Pulse 83  Temp(Src) 98.1 F (36.7 C) (Oral)  Resp 16  SpO2 98%  Breastfeeding? No Physical Exam  Constitutional: She is oriented to person, place, and time. She appears well-developed and well-nourished. No distress.  HENT:  Head: Normocephalic and atraumatic.  Eyes: EOM are normal.  Neck: Normal range of motion.  Cardiovascular: Normal rate, regular rhythm and normal heart sounds.   Pulmonary/Chest: Effort normal and breath sounds normal.  Abdominal: Soft. She exhibits no distension. There is no tenderness.  Musculoskeletal: Normal range of motion.  Neurological: She is alert and oriented to person, place, and time.  Skin: Skin is warm and dry.  Psychiatric: She has a normal mood and affect. Judgment normal.  Nursing note and vitals reviewed.   ED Course   Procedures (including critical care time) Labs Review Labs Reviewed  BASIC METABOLIC PANEL - Abnormal; Notable for the following:    Sodium 135 (*)    All other components within normal limits  CBC  URINALYSIS, ROUTINE W REFLEX MICROSCOPIC  LIPASE, BLOOD  D-DIMER, QUANTITATIVE  I-STAT TROPOININ, ED  POC URINE PREG, ED    Imaging Review Dg Chest 2 View  12/08/2013   CLINICAL DATA:  Numerous encounters for a upper respiratory tract, swollen glands in throat. Generalized weakness. Abdominal pain for more hernia. Today complaining of chest pain off and on for 1 week. Palpitations.  EXAM: CHEST  2 VIEW  COMPARISON:  08/21/2012  FINDINGS: The heart size and mediastinal contours are within normal limits. Both lungs are clear. The visualized skeletal structures are unremarkable.  IMPRESSION: No active cardiopulmonary disease.   Electronically Signed   By: Burman NievesWilliam  Stevens M.D.   On: 12/08/2013 22:28  I personally reviewed the imaging tests through PACS system I reviewed available ER/hospitalization records through the EMR    EKG Interpretation   Date/Time:  Wednesday December 08 2013 21:27:47 EST Ventricular Rate:  82 PR Interval:  149 QRS Duration: 92 QT Interval:  370 QTC Calculation: 432 R Axis:   60 Text Interpretation:  Sinus rhythm Ventricular premature complex Baseline  wander in lead(s) V3 No significant change was found Confirmed by Gilberto Stanforth   MD, Fabrizio Filip ([id number]) on 12/08/2013 11:36:08 PM      MDM   Final diagnoses:  Chest pain, unspecified chest pain type   Overall well-appearing.  Low suspicion for acute coronary syndrome.  D-dimer is normal.  Patient be discharged home in good condition.      Lyanne CoKevin M Takasha Vetere, MD 12/10/13 [phone]

## 2013-12-10 NOTE — ED Provider Notes (Signed)
TIME SEEN: 3:10 PM  CHIEF COMPLAINT: abdominal pain, constipation  HPI: Pt is a 28 y.o. F with no significant past medical history who presents to the emergency department with complaints of diffuse bloated abdominal pain that has been intermittent for the past 4 months, associated nausea and then constipation for the last 10 days. She reports she has not had a bowel movement for over a week. She states that she has had intermittent constipation in the past but never has gone this long without a bowel movement. She is passing gas. No vomiting. No fevers or chills. She was seen here 2 days ago for chest pain which she now thinks was acid reflux and is improving with Zantac. At that time she had normal labs, urinalysis, negative urine pregnancy and a normal chest x-ray. She reports her last menstrual period was October 3 but she does have Implanon. She reports it is normal for her to have irregular periods. She denies any dysuria, hematuria, urinary frequency or urgency, vaginal bleeding or discharge. She has had a prior abdominal C-section. She reports she has an abdominal hernia that was diagnosed by her OB/GYN after she had a vaginal delivery in 2014. [facility]  ROS: See HPI Constitutional: no fever  Eyes: no drainage  ENT: no runny nose   Cardiovascular:  no chest pain  Resp: no SOB  GI: no vomiting GU: no dysuria Integumentary: no rash  Allergy: no hives  Musculoskeletal: no leg swelling  Neurological: no slurred speech ROS otherwise negative  PAST MEDICAL HISTORY/PAST SURGICAL HISTORY:  Past Medical History  Diagnosis Date  . Anxiety   . Gonorrhea   . Chlamydia   . Hypertension   . Preeclampsia     With first pregnancy and 07/2012 pregnancy.  . Gestational diabetes     With first pregnancy  . Hiatal hernia     a. Small-mod hiatal hernia seen on CT 07/2012.  . Pulmonary edema     a. Post-partum CHF 07/2012.  Marland Kitchen. Chronic diastolic  heart failure     postpartum; preeclampsia;  Echocardiogram 08/21/12: Mild LVH, EF 50%, normal wall motion, mild MR, PASP 34.    MEDICATIONS:  Prior to Admission medications   Medication Sig Start Date End Date Taking? Authorizing Provider  bisacodyl (DULCOLAX) 10 MG suppository Place 10 mg rectally daily as needed for mild constipation or moderate constipation (constipation).    Yes Historical Provider, MD  ibuprofen (ADVIL,MOTRIN) 600 MG tablet Take 1 tablet (600 mg total) by mouth every 6 (six) hours. Patient taking differently: Take 600 mg by mouth every 6 (six) hours as needed for moderate pain.  08/18/12  Yes Geryl RankinsEvelyn Varnado, MD  magnesium hydroxide (MILK OF MAGNESIA) 400 MG/5ML suspension Take 15 mLs by mouth daily as needed for mild constipation (constipation).   Yes Historical Provider, MD  polyethylene glycol (MIRALAX / GLYCOLAX) packet Take 17 g by mouth daily as needed for mild constipation (constipation).    Yes Historical Provider, MD  labetalol (NORMODYNE) 200 MG tablet Take 2 tablets (400 mg total) by mouth 2 (two) times daily. Patient not taking: Reported on 12/08/2013 08/23/12   Dorien Chihuahuaara J. Richardson Doppole, MD    ALLERGIES:  No Known Allergies  SOCIAL HISTORY:  History  Substance Use Topics  . Smoking status: Never Smoker   . Smokeless tobacco: Not on file  . Alcohol Use: No     Comment: Not during pregnancy, otherwise rare on weekends  FAMILY HISTORY: Family History  Problem Relation Age of Onset  . Hypertension Father   . Diabetes Father   . Hypertension Maternal Grandmother   . Diabetes Maternal Grandmother     EXAM: BP 134/73 mmHg  Pulse 76  Temp(Src) 98.2 F (36.8 C) (Oral)  Resp 16  SpO2 100% CONSTITUTIONAL: Alert and oriented and responds appropriately to questions. Well-appearing; well-nourished HEAD: Normocephalic EYES: Conjunctivae clear, PERRL ENT: normal nose; no rhinorrhea; moist mucous membranes; pharynx without lesions noted NECK: Supple, no meningismus,  no LAD  CARD: RRR; S1 and S2 appreciated; no murmurs, no clicks, no rubs, no gallops RESP: Normal chest excursion without splinting or tachypnea; breath sounds clear and equal bilaterally; no wheezes, no rhonchi, no rales,  ABD/GI: Normal bowel sounds; non-distended; soft, non-tender, no rebound, no guarding; no tympany or fluid wave, no appreciated hernia RECTAL:  No gross blood or melena, no stool in the rectal vault, normal rectal tone BACK:  The back appears normal and is non-tender to palpation, there is no CVA tenderness EXT: Normal ROM in all joints; non-tender to palpation; no edema; normal capillary refill; no cyanosis    SKIN: Normal color for age and race; warm NEURO: Moves all extremities equally PSYCH: The patient's mood and manner are appropriate. Grooming and personal hygiene are appropriate.  MEDICAL DECISION MAKING: Pt here with constipation. I have low suspicion for bowel obstruction given her abdominal exam is so benign and she is not vomiting and is passing gas. She recently had labs, urine that were unremarkable 2 days ago. I do not feel these need to be repeated today. She reports he symptoms have been present intermittently for the past 4 months with no bowel movement in 10 days. Have discussed with patient I do not feel an enema will help her at this time given there is no stool that I can palpate in the rectal vault. Have discussed with her that she needs to be on a strict bowel regimen as she has tried miralax, milk of magnesia, suppositories but only intermittently. Will obtain acute abdominal series for further evaluation. Will give ibuprofen and Zofran.  ED PROGRESS: Patient reports feeling better. Her acute abdominal series shows scattered stool throughout the colon and normal gas pattern. No sign of obstruction. No free air. Discussed with patient starting Mirapex, Metamucil and Colace regularly. Advise increase water intake, fiber intake. I feel she is safe to be  discharged home. She verbalized understanding and is comfortable with plan.     Layla MawKristen N Ward, DO 12/10/13 1635

## 2013-12-20 ENCOUNTER — Inpatient Hospital Stay: Admission: RE | Admit: 2013-12-20 | Payer: Commercial Managed Care - PPO | Source: Ambulatory Visit

## 2014-01-17 ENCOUNTER — Encounter: Payer: Self-pay | Admitting: Medical

## 2014-01-17 ENCOUNTER — Ambulatory Visit (INDEPENDENT_AMBULATORY_CARE_PROVIDER_SITE_OTHER): Payer: Commercial Managed Care - PPO | Admitting: Medical

## 2014-01-17 VITALS — BP 117/75 | HR 77 | Temp 98.6°F | Ht 64.5 in | Wt 164.4 lb

## 2014-01-17 DIAGNOSIS — K5909 Other constipation: Secondary | ICD-10-CM

## 2014-01-17 DIAGNOSIS — K5900 Constipation, unspecified: Secondary | ICD-10-CM | POA: Insufficient documentation

## 2014-01-17 DIAGNOSIS — K469 Unspecified abdominal hernia without obstruction or gangrene: Secondary | ICD-10-CM | POA: Insufficient documentation

## 2014-01-17 DIAGNOSIS — Z8679 Personal history of other diseases of the circulatory system: Secondary | ICD-10-CM

## 2014-01-17 DIAGNOSIS — F411 Generalized anxiety disorder: Secondary | ICD-10-CM

## 2014-01-17 DIAGNOSIS — K458 Other specified abdominal hernia without obstruction or gangrene: Secondary | ICD-10-CM

## 2014-01-17 MED ORDER — SERTRALINE HCL 25 MG PO TABS: 25.0000 mg | ORAL_TABLET | Freq: Every day | ORAL | Status: DC

## 2014-01-17 NOTE — Progress Notes (Deleted)
before

## 2014-01-17 NOTE — Assessment & Plan Note (Signed)
For your reported constipation, hydrate well and exercise. Use metamucil 1 rounded tables spoon in 8 oz of water. If no bp by 3rd day then miralax.

## 2014-01-17 NOTE — Assessment & Plan Note (Signed)
Pt had some chf in the past related to her pregnancy. Pt states after delivery she had some symptoms of dypnea. Pt desrcibes in past given lasix and eventually followed up with cardiology. Pt thinks repeat echo was not done. Pt saw cardiology in 08-2012. Pt was supposed to see them again in November of 2014. Post pregnancy pt states pedal edema resolved. No sob and blood pressure came back down.

## 2014-01-17 NOTE — Assessment & Plan Note (Signed)
Also will refer you to general surgery to see if they think surgery for midline abdominal hernia indicated.

## 2014-01-17 NOTE — Assessment & Plan Note (Signed)
Anxiety- related to dealing with  autistic 28 yo, her health, and young daughter. Possilble relationship issues and working full time.  Rx of sertraline. Follow up in 3 weeks.

## 2014-01-17 NOTE — Progress Notes (Signed)
Subjective:    Patient ID: Savannah Cole, female    DOB: 12-07-1985, 28 y.o.   MRN: [id number]  HPI   I have reviewed pt PMH, PSH, FH, Social History and Surgical History.  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 Tea about 1 large cup a day. No exercise, single- 2 children  Pt has history of preeclampsia during her pregnancy. Pt had some chf in the past related to her pregnancy. Pt states after delivery she had some symptoms of dypnea. Pt desrcibes in past given lasix and eventually followed up with cardiology. Pt thinks repeat echo was not done. Pt saw cardiology in 08-2012. Pt was supposed to see them again in November of 2014. Post pregnancy pt states pedal edema resolved. No sob and blood pressure came back down.  Anxiety- related to dealing with  autistic 28 yo, her health, and young daughter. Possilble relationship issues and working full time.  Gestational diabetes in 2002. Last pregnancy blood sugar was stable.  Pt states feels mostly constipated if she does not take otc meds for constipation. She feels like small protrusion from her rectum. No rectal itching. Pt thinks may have had hemorhoid when she was pregnant  Toward end of pregnancy, mid line buldge just above the umbilicus. Since delivering and some weight loss not obvious.  Last pap 2 years.  Lmp- November 28th. Pt is on nex planon implant.    Past Medical History  Diagnosis Date  . Anxiety   . Gonorrhea   . Chlamydia   . Hypertension   . Preeclampsia     With first pregnancy and 07/2012 pregnancy.  . Gestational diabetes     With first pregnancy  . Pulmonary edema     a. Post-partum CHF 07/2012.  Marland Kitchen. Chronic diastolic heart failure     postpartum; preeclampsia;  Echocardiogram 08/21/12: Mild LVH, EF 50%, normal wall motion, mild MR, PASP 34.  Marland Kitchen. CHF (congestive heart failure)     During pregnancy  . GERD (gastroesophageal reflux disease)   . Heart murmur     Told in past. Told murmur recenlty in  hospital.  . Hiatal hernia     a. Small-mod hiatal hernia seen on CT 07/2012.    History   Social History  . Marital Status: Divorced    Spouse Name: N/A    Number of Children: N/A  . Years of Education: N/A   Occupational History  . Not on file.   Social History Main Topics  . Smoking status: Never Smoker   . Smokeless tobacco: Not on file  . Alcohol Use: No     Comment: Not during pregnancy, otherwise rare on weekends  . Drug Use: No  . Sexual Activity: Yes   Other Topics Concern  . Not on file   Social History Narrative    Past Surgical History  Procedure Laterality Date  . Cesarean section    . Dilation and curettage of uterus      due to miscarriage    Family History  Problem Relation Age of Onset  . Hypertension Father   . Diabetes Father   . Hypertension Maternal Grandmother   . Diabetes Maternal Grandmother   . Fibromyalgia Mother     No Known Allergies  Current Outpatient Prescriptions on File Prior to Visit  Medication Sig Dispense Refill  . ibuprofen (ADVIL,MOTRIN) 600 MG tablet Take 1 tablet (600 mg total) by mouth every 6 (six) hours. (Patient taking differently: Take 600 mg  by mouth every 6 (six) hours as needed for moderate pain. ) 30 tablet 0  . magnesium hydroxide (MILK OF MAGNESIA) 400 MG/5ML suspension Take 15 mLs by mouth daily as needed for mild constipation (constipation).    . polyethylene glycol (MIRALAX / GLYCOLAX) packet Take 17 g by mouth daily as needed for mild constipation (constipation).      No current facility-administered medications on file prior to visit.    BP 117/75 mmHg  Pulse 77  Temp(Src) 98.6 F (37 C) (Oral)  Ht 5' 4.5" (1.638 m)  Wt 164 lb 6.4 oz (74.571 kg)  BMI 27.79 kg/m2  SpO2 99%  LMP 12/18/2013                Review of Systems  Constitutional: Negative for fever, chills and fatigue.  HENT: Negative.   Respiratory: Negative for cough, chest tightness, shortness of breath and wheezing.     Cardiovascular: Negative for chest pain and palpitations.  Gastrointestinal: Positive for constipation. Negative for nausea, abdominal pain, diarrhea, blood in stool, abdominal distention, anal bleeding and rectal pain.       Sensation of stomach in her rectum causing constipation. Pt states she can feel this.  Genitourinary: Negative.   Musculoskeletal: Negative for back pain and neck pain.  Neurological: Negative for dizziness, seizures, facial asymmetry, speech difficulty, weakness, light-headedness, numbness and headaches.  Hematological: Negative for adenopathy.  Psychiatric/Behavioral: Negative for suicidal ideas, hallucinations, behavioral problems, confusion, sleep disturbance, dysphoric mood, decreased concentration and agitation. The patient is nervous/anxious.        Objective:   Physical Exam   General   Mental Status- Alert.  Orientation-Oriented x3. Build and Nutrition Well Nourished and Well Developed.  Skin General: Normal.  Color- Normal color. Moisture- Dry.Temperature warm. Lesions: No suspicious lesions  Head, Eyes, Ears, Nose, Thoat Ears-Normal. Auditory Canal-Bilateral-Normal. Tympanic Membrane- Bilateral-Normal. Eyes Fundi- Bilateral-Normal. Pupil- Bilateral- Direct reaction to light normal. Nose & Sinuses- Normal. Nostril- Bilateral-Normal.  Neck Neck- No Bruits or Masses. Thyroid- Normal. No thyromegaly or nodules.    Chest and Lung Exam  Percussion: Quality and Intensity:-Percussion normal. Percussion of chest reveals- No Dullness. Palpation of the chest reveals- Non-tender. Auscultation: Breath sounds-Normal. Adventitious  Sounds:No adventitious    Cardiovascular Inspection: No Heaves. Auscultation: Heart Sounds- Normal sinus rhythm without murmur or gallop, S1 WNL and S2 WNL.  Abdomen Inspection:- Inspection Normal. Inspection of abdomen reveals- No Hernias. Palpation/Percussion: Palpation and Percussion of the abdomen reveal- Non  Tender and No Palpable masses. Midline above umbilicus small small defect in abdominal wall. No buldge in this area on straight leg lift. Liver: Other Characteristics- No Hepatmegaly Spleen:Other Characteristics- No Splenomegaly. Auscultation: Auscultation of the abdomen reveals-Bowel sounds normal and No Abdominal bruits.    Rectal Anorectal Exam:Performed- Normal sphincter tone. No masses noted. Stool HEME Negative.  Neurologic Mental Status- Normal Cranial Nerves- Normal Bilaterally, Motor- Normal. Strength:5/5 normal muscle strength- All Muscles. General Assessment of Reflexes- Right Knee- 2+. Left Knee- 2+. Coordination- Normal. Gait- Normal. Meningeal Signs- None.  Musculoskeletal Global Assessment General- Joints show full range of motion without obvious deformity and Normal muscle mass. Strength 5/5 in upper and lower extremities.  Lymphatic General lymphatics Description-No Generalized lymphadenopathy.  Lower ext- no pedal edema. Negative homans signs.           Assessment & Plan:

## 2014-01-17 NOTE — Patient Instructions (Addendum)
For your chf hx, I will refer you to Dr. Jens Somrenshaw cardiology to see if he wants to do any further testing.  For your anxiety, I will rx sertraline to take daily basis.  For your reported constipation, hydrate well and exercise. Use metamucil 1 rounded tables spoon in 8 oz of water. If no bp by 3rd day then miralax.  I will go ahead and refer you to GI for your reported abnormal lump in the rectum. I did not feel this on exam but will get GI opinion.  Also will refer you to general surgery to see if they think surgery for midline abdominal hernia indicated.  Follow up in 3 wks or as needed.

## 2014-01-17 NOTE — Progress Notes (Signed)
Pre visit review using our clinic review tool, if applicable. No additional management support is needed unless otherwise documented below in the visit note. 

## 2014-01-18 ENCOUNTER — Encounter: Payer: Self-pay | Admitting: Gastroenterology

## 2014-02-05 ENCOUNTER — Encounter: Payer: Self-pay | Admitting: Medical

## 2014-02-09 ENCOUNTER — Ambulatory Visit: Payer: Commercial Managed Care - PPO | Admitting: Cardiology

## 2014-02-16 ENCOUNTER — Encounter: Payer: Self-pay | Admitting: Cardiology

## 2014-03-04 ENCOUNTER — Ambulatory Visit: Payer: Commercial Managed Care - PPO | Admitting: Gastroenterology

## 2014-05-06 ENCOUNTER — Encounter (HOSPITAL_COMMUNITY): Payer: Self-pay

## 2014-05-06 ENCOUNTER — Emergency Department (HOSPITAL_COMMUNITY)
Admission: EM | Admit: 2014-05-06 | Discharge: 2014-05-06 | Disposition: A | Discharge status: Home / Self Care | Payer: Commercial Managed Care - PPO | Attending: Emergency Medicine | Admitting: Emergency Medicine

## 2014-05-06 DIAGNOSIS — I5032 Chronic diastolic (congestive) heart failure: Secondary | ICD-10-CM | POA: Insufficient documentation

## 2014-05-06 DIAGNOSIS — K469 Unspecified abdominal hernia without obstruction or gangrene: Secondary | ICD-10-CM | POA: Diagnosis present

## 2014-05-06 DIAGNOSIS — K439 Ventral hernia without obstruction or gangrene: Secondary | ICD-10-CM | POA: Insufficient documentation

## 2014-05-06 DIAGNOSIS — Z79899 Other long term (current) drug therapy: Secondary | ICD-10-CM | POA: Insufficient documentation

## 2014-05-06 DIAGNOSIS — Z8619 Personal history of other infectious and parasitic diseases: Secondary | ICD-10-CM | POA: Insufficient documentation

## 2014-05-06 DIAGNOSIS — I10 Essential (primary) hypertension: Secondary | ICD-10-CM | POA: Insufficient documentation

## 2014-05-06 DIAGNOSIS — R011 Cardiac murmur, unspecified: Secondary | ICD-10-CM | POA: Insufficient documentation

## 2014-05-06 DIAGNOSIS — Z8632 Personal history of gestational diabetes: Secondary | ICD-10-CM | POA: Insufficient documentation

## 2014-05-06 DIAGNOSIS — F419 Anxiety disorder, unspecified: Secondary | ICD-10-CM | POA: Insufficient documentation

## 2014-05-06 LAB — CBC WITH DIFFERENTIAL/PLATELET
BASOS ABS: 0 10*3/uL (ref 0.0–0.1)
BASOS PCT: 1 % (ref 0–1)
Eosinophils Absolute: 0.2 10*3/uL (ref 0.0–0.7)
Eosinophils Relative: 3 % (ref 0–5)
HCT: 39.8 % (ref 36.0–46.0)
Hemoglobin: 13.4 g/dL (ref 12.0–15.0)
Lymphocytes Relative: 38 % (ref 12–46)
Lymphs Abs: 3.1 10*3/uL (ref 0.7–4.0)
MCH: 30 pg (ref 26.0–34.0)
MCHC: 33.7 g/dL (ref 30.0–36.0)
MCV: 89 fL (ref 78.0–100.0)
MONO ABS: 0.5 10*3/uL (ref 0.1–1.0)
MONOS PCT: 6 % (ref 3–12)
NEUTROS ABS: 4.2 10*3/uL (ref 1.7–7.7)
NEUTROS PCT: 52 % (ref 43–77)
Platelets: 325 10*3/uL (ref 150–400)
RBC: 4.47 MIL/uL (ref 3.87–5.11)
RDW: 12.9 % (ref 11.5–15.5)
WBC: 8.1 10*3/uL (ref 4.0–10.5)

## 2014-05-06 LAB — COMPREHENSIVE METABOLIC PANEL
ALT: 17 U/L (ref 0–35)
AST: 16 U/L (ref 0–37)
Albumin: 4.3 g/dL (ref 3.5–5.2)
Alkaline Phosphatase: 49 U/L (ref 39–117)
Anion gap: 9 (ref 5–15)
BUN: 16 mg/dL (ref 6–23)
CHLORIDE: 103 mmol/L (ref 96–112)
CO2: 26 mmol/L (ref 19–32)
Calcium: 9.4 mg/dL (ref 8.4–10.5)
Creatinine, Ser: 0.82 mg/dL (ref 0.50–1.10)
GFR calc Af Amer: >90 mL/min (ref >90)
GFR calc non Af Amer: >90 mL/min (ref >90)
Glucose, Bld: 98 mg/dL (ref 70–99)
POTASSIUM: 3.9 mmol/L (ref 3.5–5.1)
Sodium: 138 mmol/L (ref 135–145)
TOTAL PROTEIN: 7.1 g/dL (ref 6.0–8.3)
Total Bilirubin: 0.4 mg/dL (ref 0.3–1.2)

## 2014-05-06 LAB — LIPASE, BLOOD: Lipase: 31 U/L (ref 11–59)

## 2014-05-06 LAB — POC OCCULT BLOOD, ED: Fecal Occult Bld: NEGATIVE

## 2014-05-06 NOTE — ED Notes (Signed)
Pt. Reports generalized abdominal pain x2 weeks. Reports hx of abdominal hernia. States today is started hurting worse and will not reduce. Pt. Reports feeling constipated, taking miralax with no relief.

## 2014-05-06 NOTE — ED Provider Notes (Signed)
CSN: [id number]     Arrival date & time 05/06/14  2003 History   First MD Initiated Contact with Patient 05/06/14 2026     Chief Complaint  Patient presents with  . Hernia     (Consider location/radiation/quality/duration/timing/severity/associated sxs/prior Treatment) HPI   29 year old female with history of ventral hernia, who presents complaining of abdominal pain relating to her hernia. Patient reports she had a baby 19 months ago and subsequently diagnosed with a ventral hernia that is reducible along with having heartburn and having issues with constipation. She does follow up with the GI specialist on regular basis and also was diagnosed with having internal hemorrhoid from the last sigmoidoscopy. Last month she has an abdominal CT scan and was told that her hernia was not causing any problem. Her chest specialist did instruct patient to come to the ER if she has trouble with reducing her ventral hernia. Patient states for the past 6 days she has had difficulty having bowel movement and feel constipated taking Miralax with no adequate relief. Yesterday she noticed that her ventral hernia was tender but reducible however today it was not reducible.  Patient tried using suppository and had a Miralax without adequate relief. Patient endorse nausea without vomiting. Patient able to pass flatus.  Past Medical History  Diagnosis Date  . Anxiety   . Gonorrhea   . Chlamydia   . Hypertension   . Preeclampsia     With first pregnancy and 07/2012 pregnancy.  . Gestational diabetes     With first pregnancy  . Pulmonary edema     a. Post-partum CHF 07/2012.  Marland Kitchen Chronic diastolic heart failure     postpartum; preeclampsia;  Echocardiogram 08/21/12: Mild LVH, EF 50%, normal wall motion, mild MR, PASP 34.  Marland Kitchen CHF (congestive heart failure)     During pregnancy  . GERD (gastroesophageal reflux disease)   . Heart murmur     Told in past. Told murmur recenlty in hospital.  . Hiatal hernia     a.  Small-mod hiatal hernia seen on CT 07/2012.   Past Surgical History  Procedure Laterality Date  . Cesarean section    . Dilation and curettage of uterus      due to miscarriage   Family History  Problem Relation Age of Onset  . Hypertension Father   . Diabetes Father   . Hypertension Maternal Grandmother   . Diabetes Maternal Grandmother   . Fibromyalgia Mother    History  Substance Use Topics  . Smoking status: Never Smoker   . Smokeless tobacco: Not on file  . Alcohol Use: No     Comment: Not during pregnancy, otherwise rare on weekends   OB History    Gravida Para Term Preterm AB TAB SAB Ectopic Multiple Living   Review of Systems  All other systems reviewed and are negative.     Allergies  Review of patient's allergies indicates no known allergies.  Home Medications   Prior to Admission medications   Medication Sig Start Date End Date Taking? Authorizing Provider  docusate sodium (COLACE) 50 MG capsule Take 50 mg by mouth 2 (two) times daily.    Historical Provider, MD  ibuprofen (ADVIL,MOTRIN) 600 MG tablet Take 1 tablet (600 mg total) by mouth every 6 (six) hours. Patient taking differently: Take 600 mg by mouth every 6 (six) hours as needed for moderate pain.  08/18/12   Geryl Rankins,  MD  magnesium hydroxide (MILK OF MAGNESIA) 400 MG/5ML suspension Take 15 mLs by mouth daily as needed for mild constipation (constipation).    Historical Provider, MD  polyethylene glycol (MIRALAX / GLYCOLAX) packet Take 17 g by mouth daily as needed for mild constipation (constipation).     Historical Provider, MD  sertraline (ZOLOFT) 25 MG tablet Take 1 tablet (25 mg total) by mouth daily. 01/17/14   Bayard BeaverEdward M Saguier, PA-C   BP 133/91 mmHg  Pulse 70  Temp(Src) 98 F (36.7 C) (Oral)  Resp 16  SpO2 100% Physical Exam  Constitutional: She is oriented to person, place, and time. She appears well-developed and well-nourished. No distress.  HENT:  Head:  Atraumatic.  Eyes: Conjunctivae are normal.  Neck: Neck supple.  Cardiovascular: Normal rate and regular rhythm.   Pulmonary/Chest: Effort normal and breath sounds normal.  Abdominal: Soft. Bowel sounds are normal. She exhibits no distension. There is tenderness (Ventral hernia noted to mid to right abdomen, mildly tender, reducible by me. No overlying skin changes, no surgical scar.). There is no rebound and no guarding.  Neurological: She is alert and oriented to person, place, and time.  Skin: No rash noted.  Psychiatric: She has a normal mood and affect.  Nursing note and vitals reviewed.   ED Course  Procedures (including critical care time)  9:16 PM Patient with early abdominal ventral hernia, reducible by me. Symptoms improved. Patient will follow-up with [facility] for elective surgical repair as needed. Return precautions discussed. Otherwise the labs are unremarkable.  Pt request for Dr. Magnus IvanBlackman for her care.  Labs Review Labs Reviewed  CBC WITH DIFFERENTIAL/PLATELET  COMPREHENSIVE METABOLIC PANEL  LIPASE, BLOOD  POC OCCULT BLOOD, ED    Imaging Review No results found.   EKG Interpretation None      MDM   Final diagnoses:  Ventral hernia without obstruction or gangrene    BP 133/91 mmHg  Pulse 70  Temp(Src) 98 F (36.7 C) (Oral)  Resp 16  SpO2 100%     Fayrene HelperBowie Yusuf Yu, PA-C 05/06/14 2117  Tilden FossaElizabeth Rees, MD 05/06/14 2121

## 2014-05-06 NOTE — Discharge Instructions (Signed)
Please discussed with Surgeon for elective repair of your hernia.   Hernia A hernia occurs when an internal organ pushes out through a weak spot in the abdominal wall. Hernias most commonly occur in the groin and around the navel. Hernias often can be pushed back into place (reduced). Most hernias tend to get worse over time. Some abdominal hernias can get stuck in the opening (irreducible or incarcerated hernia) and cannot be reduced. An irreducible abdominal hernia which is tightly squeezed into the opening is at risk for impaired blood supply (strangulated hernia). A strangulated hernia is a medical emergency. Because of the risk for an irreducible or strangulated hernia, surgery may be recommended to repair a hernia. CAUSES   Heavy lifting.  Prolonged coughing.  Straining to have a bowel movement.  A cut (incision) made during an abdominal surgery. HOME CARE INSTRUCTIONS   Bed rest is not required. You may continue your normal activities.  Avoid lifting more than 10 pounds (4.5 kg) or straining.  Cough gently. If you are a smoker it is best to stop. Even the best hernia repair can break down with the continual strain of coughing. Even if you do not have your hernia repaired, a cough will continue to aggravate the problem.  Do not wear anything tight over your hernia. Do not try to keep it in with an outside bandage or truss. These can damage abdominal contents if they are trapped within the hernia sac.  Eat a normal diet.  Avoid constipation. Straining over long periods of time will increase hernia size and encourage breakdown of repairs. If you cannot do this with diet alone, stool softeners may be used. SEEK IMMEDIATE MEDICAL CARE IF:   You have a fever.  You develop increasing abdominal pain.  You feel nauseous or vomit.  Your hernia is stuck outside the abdomen, looks discolored, feels hard, or is tender.  You have any changes in your bowel habits or in the hernia that are  unusual for you.  You have increased pain or swelling around the hernia.  You cannot push the hernia back in place by applying gentle pressure while lying down. MAKE SURE YOU:   Understand these instructions.  Will watch your condition.  Will get help right away if you are not doing well or get worse. Document Released: 01/07/2005 Document Revised: 04/01/2011 Document Reviewed: 08/27/2007 [facility] Patient Information 2015 [place], [place]. This information is not intended to replace advice given to you by your health care provider. Make sure you discuss any questions you have with your health care provider.  Laparoscopic Ventral Hernia Repair Laparoscopic ventral hernia repairis a surgery to fix a ventral hernia. Aventral hernia, also called an incisional hernia, is a bulge of body tissue or intestines that pushes through the front part of the abdomen. This can happen if the connective tissue covering the muscles over the abdomen has a weak spot or is torn because of a surgical cut (incision) from a previous surgery. Laparoscopic ventral hernia repair is often done soon after diagnosis to stop the hernia from getting bigger, becoming uncomfortable, or becoming an emergency. This surgery usually takes about 2 hours, but the time can vary greatly. LET [facility] CARE PROVIDER KNOW ABOUT:  Any allergies you have.  All medicines you are taking, including steroids, vitamins, herbs, eye drops, creams, and over-the-counter medicines.  Previous problems you or members of your family have had with the use of anesthetics.  Any blood disorders you have.  Previous surgeries  you have had.  Medical conditions you have. RISKS AND COMPLICATIONS  Generally, laparoscopic ventral hernia repair is a safe procedure. However, as with any surgical procedure, problems can occur. Possible problems include:  Bleeding.  Trouble passing urine or having a bowel movement after the  surgery.  Infection.  Pneumonia.  Blood clots.  Pain in the area of the hernia.  A bulge in the area of the hernia that may be caused by a collection of fluid.  Injury to intestines or other structures in the abdomen.  Return of the hernia after surgery. In some cases, your health care provider may need to stop the laparoscopic procedure and do regular, open surgery. This may be necessary for very difficult hernias, when organs are hard to see, or when bleeding problems occur during surgery. BEFORE THE PROCEDURE   You may need to have blood tests, urine tests, a chest X-ray, or an electrocardiogram done before the day of the surgery.  Ask your health care provider about changing or stopping your regular medicines. This is especially important if you are taking diabetes medicines or blood thinners.  You may need to wash with a special type of germ-killing soap.  Do not eat or drink anything after midnight the night before the procedure or as directed by your health care provider.  Make plans to have someone drive you home after the procedure. PROCEDURE   Small monitors will be put on your body. They are used to check your heart, blood pressure, and oxygen level.  An IV access tube will be put into a vein in your hand or arm. Fluids and medicine will flow directly into your body through the IV tube.  You will be given medicine that makes you go to sleep (general anesthetic).  Your abdomen will be cleaned with a special soap to kill any germs on your skin.  Once you are asleep, several small incisions will be made in your abdomen.  The large space in your abdomen will be filled with air so that it expands. This gives your health care provider more room and a better view.  A thin, lighted tube with a tiny camera on the end (laparoscope) is put through a small incision in your abdomen. The camera on the laparoscope sends a picture to a TV screen in the operating room. This gives  your health care provider a good view inside your abdomen.  Hollow tubes are put through the other small incisions in your abdomen. The tools needed for the procedure are put through these tubes.  Your health care provider puts the tissue or intestines that formed the hernia back in place.  A screen-like patch (mesh) is used to close the hernia. This helps make the area stronger. Stitches, tacks, or staples are used to keep the mesh in place.  Medicine and a bandage (dressing) or skin glue will be put over the incisions. AFTER THE PROCEDURE   You will stay in a recovery area until the anesthetic wears off. Your blood pressure and pulse will be checked often.  You may be able to go home the same day or may need to stay in the hospital for 1-2 days after surgery. Your health care provider will decide when you can go home.  You may feel some pain. You may be given medicine for pain.  You will be urged to do breathing exercises that involve taking deep breaths. This helps prevent a lung infection after a surgery.  You may have  to wear compression stockings while you are in the hospital. These stockings help keep blood clots from forming in your legs. Document Released: 12/25/2011 Document Revised: 01/12/2013 Document Reviewed: 12/25/2011 [facility] Patient Information 2015 [place]. This information is not intended to replace advice given to you by your health care provider. Make sure you discuss any questions you have with your health care provider.

## 2014-05-12 ENCOUNTER — Telehealth: Payer: Self-pay | Admitting: Medical

## 2014-05-12 DIAGNOSIS — K439 Ventral hernia without obstruction or gangrene: Secondary | ICD-10-CM

## 2014-05-12 DIAGNOSIS — R634 Abnormal weight loss: Secondary | ICD-10-CM

## 2014-05-12 NOTE — Telephone Encounter (Signed)
Relation to pt: self  Call back number: [phone]   Reason for call:  Pt [facility] phone # [phone] and fax # [phone] for a hernia. Please advise

## 2014-05-16 ENCOUNTER — Encounter: Payer: Self-pay | Admitting: Medical

## 2014-05-16 ENCOUNTER — Ambulatory Visit (INDEPENDENT_AMBULATORY_CARE_PROVIDER_SITE_OTHER): Payer: Commercial Managed Care - PPO | Admitting: Medical

## 2014-05-16 VITALS — BP 132/80 | HR 100 | Temp 98.3°F | Ht 64.5 in | Wt 155.6 lb

## 2014-05-16 DIAGNOSIS — R634 Abnormal weight loss: Secondary | ICD-10-CM

## 2014-05-16 DIAGNOSIS — R739 Hyperglycemia, unspecified: Secondary | ICD-10-CM | POA: Diagnosis not present

## 2014-05-16 DIAGNOSIS — G8929 Other chronic pain: Secondary | ICD-10-CM | POA: Insufficient documentation

## 2014-05-16 DIAGNOSIS — R109 Unspecified abdominal pain: Secondary | ICD-10-CM | POA: Insufficient documentation

## 2014-05-16 DIAGNOSIS — R1013 Epigastric pain: Secondary | ICD-10-CM | POA: Diagnosis not present

## 2014-05-16 DIAGNOSIS — K5909 Other constipation: Secondary | ICD-10-CM

## 2014-05-16 DIAGNOSIS — K439 Ventral hernia without obstruction or gangrene: Secondary | ICD-10-CM

## 2014-05-16 DIAGNOSIS — R110 Nausea: Secondary | ICD-10-CM

## 2014-05-16 LAB — COMPREHENSIVE METABOLIC PANEL
ALBUMIN: 4.2 g/dL (ref 3.5–5.2)
ALT: 13 U/L (ref 0–35)
AST: 14 U/L (ref 0–37)
Alkaline Phosphatase: 47 U/L (ref 39–117)
BILIRUBIN TOTAL: 0.4 mg/dL (ref 0.2–1.2)
BUN: 15 mg/dL (ref 6–23)
CO2: 27 meq/L (ref 19–32)
CREATININE: 0.63 mg/dL (ref 0.40–1.20)
Calcium: 9.2 mg/dL (ref 8.4–10.5)
Chloride: 104 mEq/L (ref 96–112)
GFR: 141.65 mL/min (ref >60.00)
GLUCOSE: 99 mg/dL (ref 70–99)
Potassium: 3.9 mEq/L (ref 3.5–5.1)
SODIUM: 134 meq/L — AB (ref 135–145)
Total Protein: 6.9 g/dL (ref 6.0–8.3)

## 2014-05-16 LAB — CBC WITH DIFFERENTIAL/PLATELET
BASOS ABS: 0 10*3/uL (ref 0.0–0.1)
BASOS PCT: 0.5 % (ref 0.0–3.0)
Eosinophils Absolute: 0.1 10*3/uL (ref 0.0–0.7)
Eosinophils Relative: 2 % (ref 0.0–5.0)
HEMATOCRIT: 38.4 % (ref 36.0–46.0)
Hemoglobin: 12.9 g/dL (ref 12.0–15.0)
Lymphocytes Relative: 21.7 % (ref 12.0–46.0)
Lymphs Abs: 1.5 10*3/uL (ref 0.7–4.0)
MCHC: 33.6 g/dL (ref 30.0–36.0)
MCV: 90.3 fl (ref 78.0–100.0)
MONO ABS: 0.3 10*3/uL (ref 0.1–1.0)
Monocytes Relative: 4.7 % (ref 3.0–12.0)
NEUTROS PCT: 71.1 % (ref 43.0–77.0)
Neutro Abs: 4.8 10*3/uL (ref 1.4–7.7)
PLATELETS: 295 10*3/uL (ref 150.0–400.0)
RBC: 4.25 Mil/uL (ref 3.87–5.11)
RDW: 13.3 % (ref 11.5–15.5)
WBC: 6.7 10*3/uL (ref 4.0–10.5)

## 2014-05-16 LAB — LIPASE: Lipase: 14 U/L (ref 11.0–59.0)

## 2014-05-16 LAB — HEMOGLOBIN A1C: HEMOGLOBIN A1C: 5.5 % (ref 4.6–6.5)

## 2014-05-16 MED ORDER — PROMETHAZINE HCL 12.5 MG PO TABS: 12.5000 mg | ORAL_TABLET | Freq: Three times a day (TID) | ORAL | Status: DC | PRN

## 2014-05-16 MED ORDER — LINACLOTIDE 145 MCG PO CAPS: 145.0000 ug | ORAL_CAPSULE | Freq: Every day | ORAL | Status: DC

## 2014-05-16 MED ORDER — RANITIDINE HCL 150 MG PO CAPS
150.0000 mg | ORAL_CAPSULE | Freq: Two times a day (BID) | ORAL | Status: DC
Start: 2014-05-16 — End: 2016-08-11

## 2014-05-16 NOTE — Assessment & Plan Note (Signed)
Hx of during prior pregnancy. Will go ahead and get a1-c. If elevated this could be reason for weight loss.

## 2014-05-16 NOTE — Assessment & Plan Note (Signed)
Try to get GI doctor notes/ studies. Ask referral staff if they had plans to see her again?? Ask pt to get us old US reports and CT studies.

## 2014-05-16 NOTE — Assessment & Plan Note (Signed)
Pain on exam today. Will gt labs and include lipase. Rx ranitidine.

## 2014-05-16 NOTE — Progress Notes (Signed)
Pre visit review using our clinic review tool, if applicable. No additional management support is needed unless otherwise documented below in the visit note. 

## 2014-05-16 NOTE — Assessment & Plan Note (Signed)
States chronic constipation. No loose stools. This occurs only with miralax. Sounds possilbe ibc-constipation type. Rx linzess.

## 2014-05-16 NOTE — Progress Notes (Signed)
Subjective:    Patient ID: Savannah MusselLatoya Cole, female    DOB: October 12, 1985, 29 y.o.   MRN: [id number]  HPI  Pt in states she did see GI. Pt GI thought some internal hemorrhoids and small ventral hernia. CT of abdomen to evaluate the possible hernia.  Pt states ultrasound done in January with [facility]. I don't see that report.  Pt has schedule to see general surgeon to evaluate possible hernia.   Pt states usually constipated in the past. Also some loose stools. But usually loose stools related to taking miralax. Sometimes skips miralax then feels back up. Then will use on 3rd day.   Pt states her BM have been irregular since October 2015. She reports chronic mild nausea.   Pt loosing weight and not trying.  Pt is on nexplanon.   Review of Systems  Constitutional: Negative for fever, chills, diaphoresis, activity change and fatigue.  Respiratory: Negative for cough, chest tightness and shortness of breath.   Cardiovascular: Negative for chest pain, palpitations and leg swelling.  Gastrointestinal: Positive for constipation. Negative for nausea, vomiting and abdominal pain.       Daily.  Endocrine: Positive for polydipsia. Negative for polyphagia and polyuria.  Genitourinary: Negative for dysuria, hematuria, enuresis and vaginal pain.  Musculoskeletal: Negative for back pain, neck pain and neck stiffness.  Neurological: Negative for dizziness, tremors, seizures, syncope, facial asymmetry, speech difficulty, weakness, light-headedness, numbness and headaches.  Psychiatric/Behavioral: Negative for behavioral problems, confusion and agitation. The patient is not nervous/anxious.     Past Medical History  Diagnosis Date  . Anxiety   . Gonorrhea   . Chlamydia   . Hypertension   . Preeclampsia     With first pregnancy and 07/2012 pregnancy.  . Gestational diabetes     With first pregnancy  . Pulmonary edema     a. Post-partum CHF 07/2012.  Marland Kitchen. Chronic diastolic heart failure    postpartum; preeclampsia;  Echocardiogram 08/21/12: Mild LVH, EF 50%, normal wall motion, mild MR, PASP 34.  Marland Kitchen. CHF (congestive heart failure)     During pregnancy  . GERD (gastroesophageal reflux disease)   . Heart murmur     Told in past. Told murmur recenlty in hospital.  . Hiatal hernia     a. Small-mod hiatal hernia seen on CT 07/2012.    History   Social History  . Marital Status: Divorced    Spouse Name: N/A  . Number of Children: N/A  . Years of Education: N/A   Occupational History  . Not on file.   Social History Main Topics  . Smoking status: Never Smoker   . Smokeless tobacco: Not on file  . Alcohol Use: No     Comment: Not during pregnancy, otherwise rare on weekends  . Drug Use: No  . Sexual Activity: Yes   Other Topics Concern  . Not on file   Social History Narrative    Past Surgical History  Procedure Laterality Date  . Cesarean section    . Dilation and curettage of uterus      due to miscarriage    Family History  Problem Relation Age of Onset  . Hypertension Father   . Diabetes Father   . Hypertension Maternal Grandmother   . Diabetes Maternal Grandmother   . Fibromyalgia Mother     No Known Allergies  Current Outpatient Prescriptions on File Prior to Visit  Medication Sig Dispense Refill  . ibuprofen (ADVIL,MOTRIN) 600 MG tablet Take 1 tablet (600 mg total)  by mouth every 6 (six) hours. (Patient taking differently: Take 600 mg by mouth every 6 (six) hours as needed for moderate pain. ) 30 tablet 0  . sertraline (ZOLOFT) 25 MG tablet Take 1 tablet (25 mg total) by mouth daily. (Patient not taking: Reported on 05/16/2014) 30 tablet 2   No current facility-administered medications on file prior to visit.    BP 132/80 mmHg  Pulse 100  Temp(Src) 98.3 F (36.8 C) (Oral)  Ht 5' 4.5" (1.638 m)  Wt 155 lb 9.6 oz (70.58 kg)  BMI 26.31 kg/m2  SpO2 99%       Objective:   Physical Exam   General Appearance- Not in acute  distress.  HEENT Eyes- Scleraeral/Conjuntiva-bilat- Not Yellow. Mouth & Throat- Normal.  Chest and Lung Exam Auscultation: Breath sounds:-Normal. Adventitious sounds:- No Adventitious sounds.  Cardiovascular Auscultation:Rythm - Regular. Heart Sounds -Normal heart sounds.  Abdomen Inspection:-Inspection Normal.  Palpation/Perucssion: Palpation and Percussion of the abdomen reveal- mild- moderate epigastric and RUQ  Tender, No Rebound tenderness, No rigidity(Guarding) and No Palpable abdominal masses.  Liver:-Normal.  Spleen:- Normal.   Back- no cva tenderness.       Assessment & Plan:

## 2014-05-16 NOTE — Assessment & Plan Note (Signed)
Pt schedule to see surgeon near future.

## 2014-05-16 NOTE — Patient Instructions (Signed)
Pain in the abdomen Pain on exam today. Will gt labs and include lipase. Rx ranitidine.   Hyperglycemia Hx of during prior pregnancy. Will go ahead and get a1-c. If elevated this could be reason for weight loss.   Constipation States chronic constipation. No loose stools. This occurs only with miralax. Sounds possilbe ibc-constipation type. Rx linzess.   Loss of weight Try to get GI doctor notes/ studies. Ask referral staff if they had plans to see her again?? Ask pt to get us old US reports and CT studies.   Ventral hernia Pt schedule to see surgeon near future.     Follow up in one month or as needed,

## 2014-05-16 NOTE — Assessment & Plan Note (Signed)
This has been the case for weeks with her GI symptoms and constipation. Will rx phenergan limited rx. To use only at night. Not to use with zofran which she has. She thinks zofran does not help.

## 2014-05-17 ENCOUNTER — Ambulatory Visit: Payer: Commercial Managed Care - PPO | Admitting: Medical

## 2014-05-17 ENCOUNTER — Other Ambulatory Visit (INDEPENDENT_AMBULATORY_CARE_PROVIDER_SITE_OTHER): Payer: Commercial Managed Care - PPO

## 2014-05-17 DIAGNOSIS — R634 Abnormal weight loss: Secondary | ICD-10-CM | POA: Diagnosis not present

## 2014-05-17 LAB — TSH: TSH: 0.58 u[IU]/mL (ref 0.35–4.50)

## 2014-06-07 ENCOUNTER — Ambulatory Visit: Payer: Commercial Managed Care - PPO | Admitting: Medical

## 2014-06-07 ENCOUNTER — Other Ambulatory Visit: Payer: Self-pay | Admitting: Obstetrics and Gynecology

## 2014-06-22 ENCOUNTER — Ambulatory Visit: Payer: Commercial Managed Care - PPO | Admitting: Medical

## 2014-06-22 DIAGNOSIS — Z0289 Encounter for other administrative examinations: Secondary | ICD-10-CM

## 2014-06-28 ENCOUNTER — Encounter: Payer: Self-pay | Admitting: Medical

## 2014-06-28 ENCOUNTER — Telehealth: Payer: Self-pay | Admitting: Medical

## 2014-06-28 NOTE — Telephone Encounter (Signed)
No charge. 

## 2014-06-28 NOTE — Telephone Encounter (Signed)
Pt was no show for appt on 06/22/14- letter sent. Charge?

## 2015-01-24 IMAGING — CR DG ABDOMEN ACUTE W/ 1V CHEST
3 series · 3 of 3 positions shown · non-contrast
Comparison: Chest radiographs 12/08/2013

CLINICAL DATA: Chest pain, abdominal pain, constipation for 10
days, history hypertension, chronic diastolic heart failure

EXAM:
ACUTE ABDOMEN SERIES (ABDOMEN 2 VIEW & CHEST 1 VIEW)

[w chest pa]
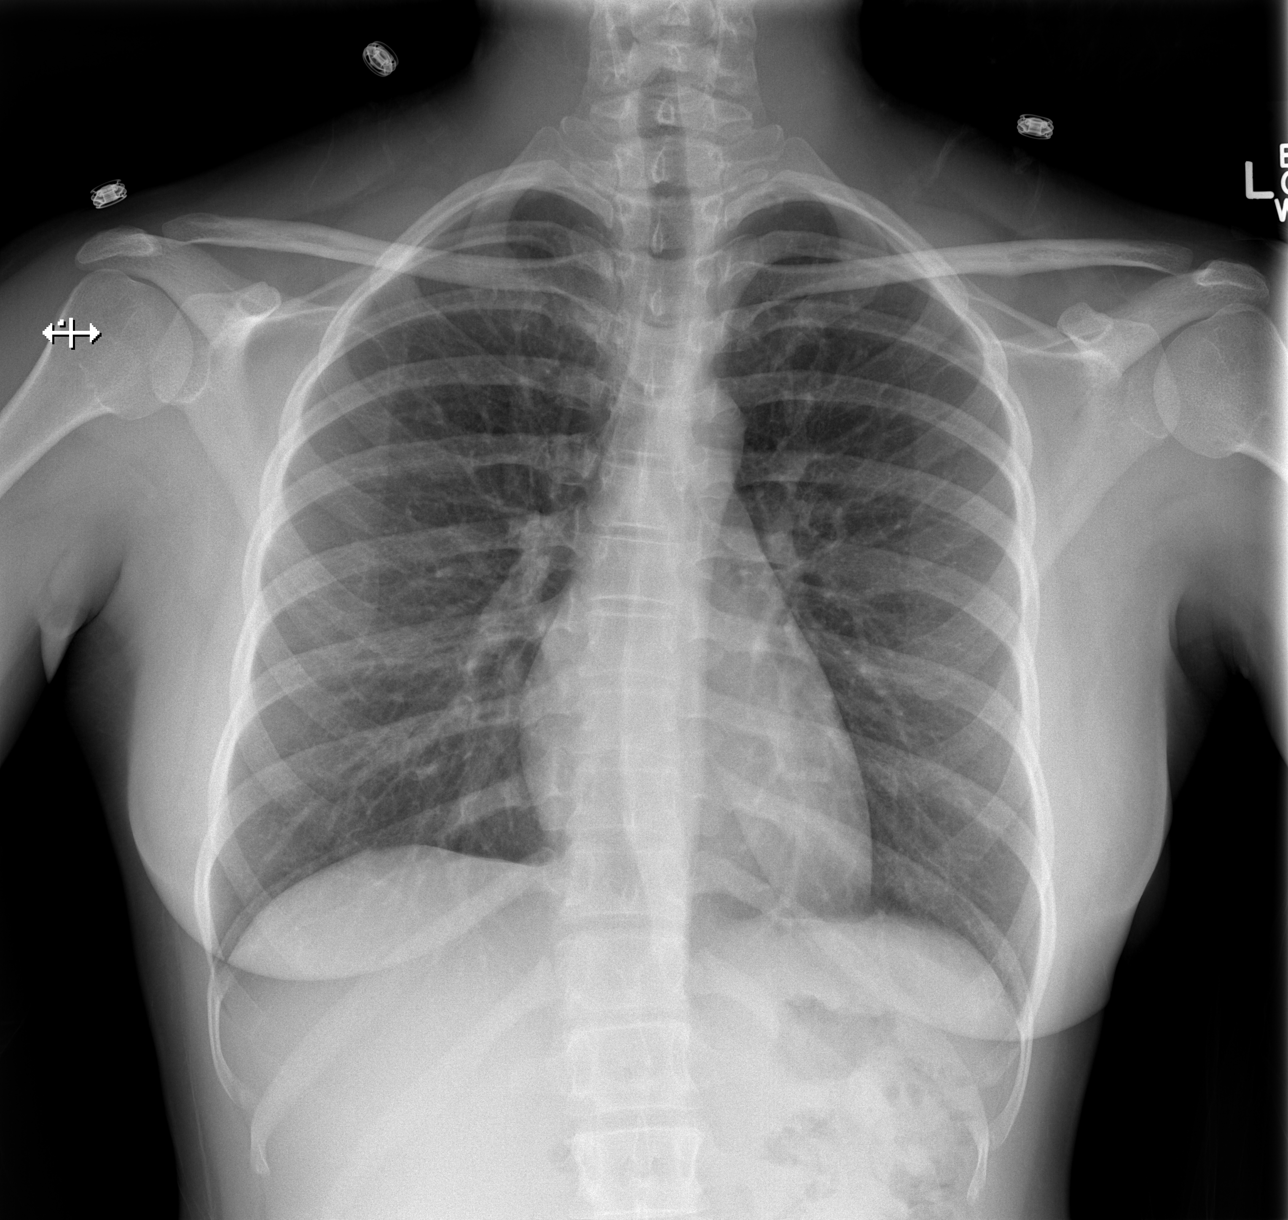

[w abdomen upright]
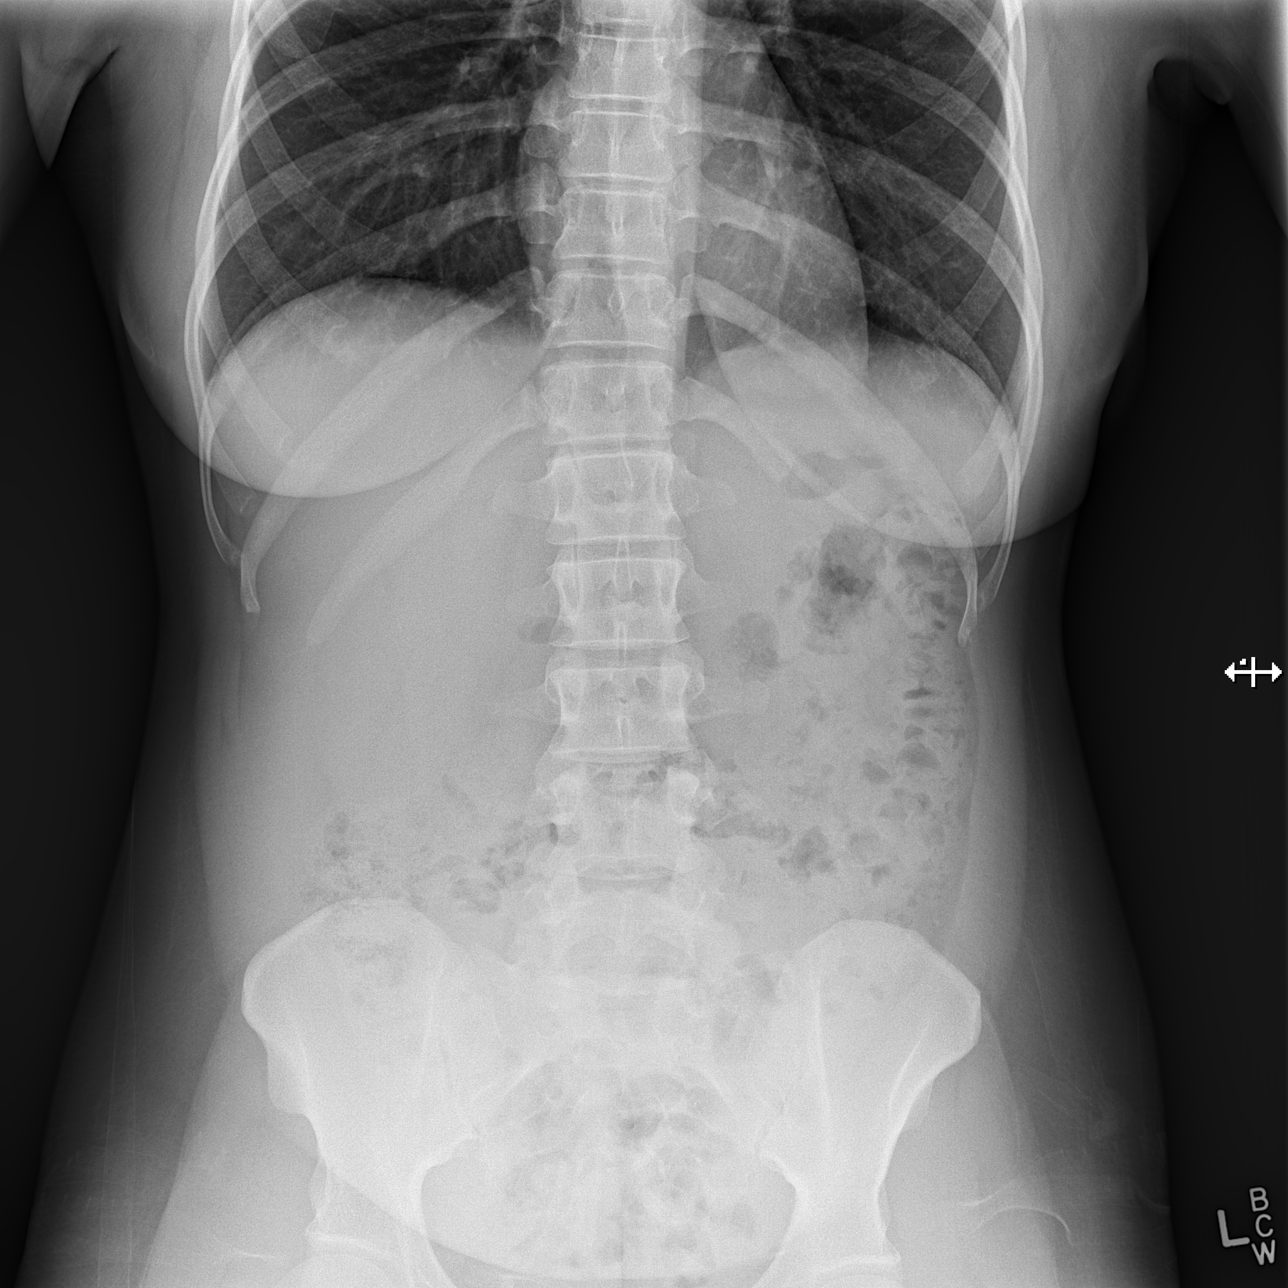

[t abdomen supine]
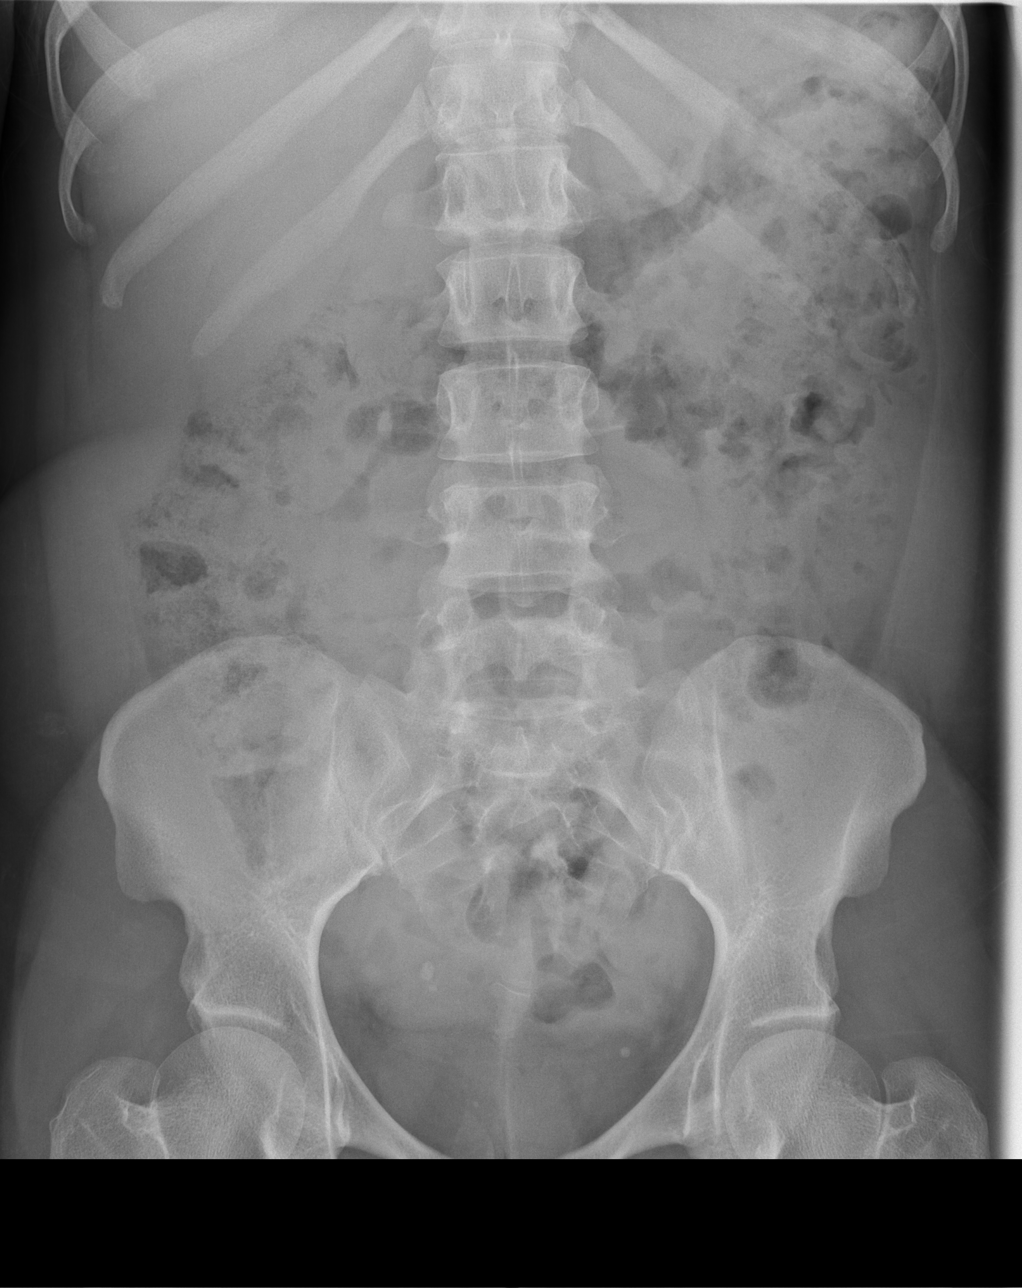

[3 of 3 positions shown; findings below may reference images not displayed]

FINDINGS: Normal heart size, mediastinal contours and pulmonary vascularity.

Lungs clear.

No pleural effusion or pneumothorax.

Thoracic scoliosis.

Normal bowel gas pattern.

Scattered stool throughout colon.

No bowel dilatation, bowel wall thickening, or free intraperitoneal
air.

Small pelvic phleboliths without definite urinary tract
calcification.

No acute osseous findings.
IMPRESSION: Thoracic scoliosis.

Otherwise normal exam.

## 2015-07-10 ENCOUNTER — Other Ambulatory Visit (HOSPITAL_COMMUNITY)
Admission: RE | Admit: 2015-07-10 | Discharge: 2015-07-10 | Disposition: A | Discharge status: Home / Self Care | Payer: Commercial Managed Care - PPO | Source: Ambulatory Visit | Attending: Obstetrics and Gynecology | Admitting: Obstetrics and Gynecology

## 2015-07-10 ENCOUNTER — Other Ambulatory Visit: Payer: Self-pay | Admitting: Obstetrics and Gynecology

## 2015-07-10 DIAGNOSIS — Z113 Encounter for screening for infections with a predominantly sexual mode of transmission: Secondary | ICD-10-CM | POA: Insufficient documentation

## 2015-07-10 DIAGNOSIS — Z1151 Encounter for screening for human papillomavirus (HPV): Secondary | ICD-10-CM | POA: Diagnosis present

## 2015-07-10 DIAGNOSIS — Z01419 Encounter for gynecological examination (general) (routine) without abnormal findings: Secondary | ICD-10-CM | POA: Insufficient documentation

## 2015-07-11 LAB — CYTOLOGY - PAP

## 2016-08-11 ENCOUNTER — Emergency Department (HOSPITAL_COMMUNITY)
Admission: EM | Admit: 2016-08-11 | Discharge: 2016-08-11 | Disposition: A | Discharge status: Home / Self Care | Payer: Commercial Managed Care - PPO | Attending: Emergency Medicine | Admitting: Emergency Medicine

## 2016-08-11 ENCOUNTER — Telehealth: Payer: Self-pay | Admitting: Medical

## 2016-08-11 ENCOUNTER — Encounter (HOSPITAL_COMMUNITY): Payer: Self-pay | Admitting: *Deleted

## 2016-08-11 DIAGNOSIS — R448 Other symptoms and signs involving general sensations and perceptions: Secondary | ICD-10-CM | POA: Insufficient documentation

## 2016-08-11 DIAGNOSIS — F419 Anxiety disorder, unspecified: Secondary | ICD-10-CM | POA: Insufficient documentation

## 2016-08-11 DIAGNOSIS — I5032 Chronic diastolic (congestive) heart failure: Secondary | ICD-10-CM | POA: Insufficient documentation

## 2016-08-11 DIAGNOSIS — I110 Hypertensive heart disease with heart failure: Secondary | ICD-10-CM | POA: Insufficient documentation

## 2016-08-11 DIAGNOSIS — H579 Unspecified disorder of eye and adnexa: Secondary | ICD-10-CM

## 2016-08-11 MED ORDER — TETRACAINE HCL 0.5 % OP SOLN
1.0000 [drp] | Freq: Once | OPHTHALMIC | Status: AC
  Administered 2016-08-11: 1 [drp] via OPHTHALMIC
  Filled 2016-08-11: qty 4

## 2016-08-11 MED ORDER — ERYTHROMYCIN 5 MG/GM OP OINT
1.0000 "application " | TOPICAL_OINTMENT | Freq: Once | OPHTHALMIC | Status: AC
  Administered 2016-08-11: 1 via OPHTHALMIC
  Filled 2016-08-11: qty 3.5

## 2016-08-11 MED ORDER — FLUORESCEIN SODIUM 0.6 MG OP STRP
1.0000 | ORAL_STRIP | Freq: Once | OPHTHALMIC | Status: AC
  Administered 2016-08-11: 1 via OPHTHALMIC
  Filled 2016-08-11: qty 1

## 2016-08-11 NOTE — ED Notes (Signed)
PT states understanding of care given, follow up care, and medication prescribed. PT ambulated from ED to car with a steady gait. 

## 2016-08-11 NOTE — Telephone Encounter (Signed)
Pt in ED the other day. Looks like I have not seen in her ine 2 years. Please call and ask her to schedule complete physical and come in fasting. Let me know if she declines as I need to decide if I will continue to be her pcp as she has not seen me in more than 2 years.

## 2016-08-11 NOTE — ED Triage Notes (Signed)
The pt has a contact lens stuck in her lt eye she was attempting to remove  When it became twisted and now she is unable to remove it.  lmp implant

## 2016-08-11 NOTE — ED Provider Notes (Signed)
[facility] Provider Note   CSN: [id number] Arrival date & time: 08/11/16  0102     History   Chief Complaint Chief Complaint  Patient presents with  . Eye Problem    HPI Savannah Cole is a 31 y.o. female.  Patient presents to the emergency department with chief complaint of foreign body sensation left eye. She states that she was trying to remove her contact tonight, but does not believe that she got it out. She complains of pain and foreign body sensation. She states that she also may have gotten a small amount of eczema cream in her eye.  She denies vision changes.  Denies any other symptoms.   The history is provided by the patient. No language interpreter was used.    Past Medical History:  Diagnosis Date  . Anxiety   . CHF (congestive heart failure) (HCC)    During pregnancy  . Chlamydia   . Chronic diastolic heart failure (HCC)    postpartum; preeclampsia;  Echocardiogram 08/21/12: Mild LVH, EF 50%, normal wall motion, mild MR, PASP 34.  Marland Kitchen GERD (gastroesophageal reflux disease)   . Gestational diabetes    With first pregnancy  . Gonorrhea   . Heart murmur    Told in past. Told murmur recenlty in hospital.  . Hiatal hernia    a. Small-mod hiatal hernia seen on CT 07/2012.  Marland Kitchen Hypertension   . Preeclampsia    With first pregnancy and 07/2012 pregnancy.  . Pulmonary edema    a. Post-partum CHF 07/2012.    Patient Active Problem List   Diagnosis Date Noted  . Pain in the abdomen 05/16/2014  . Hyperglycemia 05/16/2014  . Loss of weight 05/16/2014  . Ventral hernia 05/16/2014  . Nausea without vomiting 05/16/2014  . History of CHF (congestive heart failure) 01/17/2014  . Generalized anxiety disorder 01/17/2014  . Constipation 01/17/2014  . Elevated LFTs 08/22/2012  . Acute diastolic heart failure (HCC) 08/21/2012  . Chronic hypertension with superimposed preeclampsia 08/10/2012  . Previous cesarean delivery, antepartum condition or complication  08/10/2012    Past Surgical History:  Procedure Laterality Date  . CESAREAN SECTION    . DILATION AND CURETTAGE OF UTERUS     due to miscarriage    OB History    Gravida Para Term Preterm AB Living   6 2 1 1 4 2    SAB TAB Ectopic Multiple Live Births   3       1       Home Medications    Prior to Admission medications   Medication Sig Start Date End Date Taking? Authorizing Provider  ibuprofen (ADVIL,MOTRIN) 600 MG tablet Take 1 tablet (600 mg total) by mouth every 6 (six) hours. Patient taking differently: Take 600 mg by mouth every 6 (six) hours as needed for moderate pain.  08/18/12  Yes Geryl Rankins, MD  promethazine (PHENERGAN) 12.5 MG tablet Take 1 tablet (12.5 mg total) by mouth every 8 (eight) hours as needed for nausea or vomiting. 05/16/14  Yes Saguier, Kateri Mc    Family History Family History  Problem Relation Age of Onset  . Hypertension Father   . Diabetes Father   . Fibromyalgia Mother   . Hypertension Maternal Grandmother   . Diabetes Maternal Grandmother     Social History Social History  Substance Use Topics  . Smoking status: Never Smoker  . Smokeless tobacco: Never Used  . Alcohol use No     Comment: Not during pregnancy, otherwise rare  on weekends     Allergies   Patient has no known allergies.   Review of Systems Review of Systems  All other systems reviewed and are negative.    Physical Exam Updated Vital Signs BP (!) 152/84 (BP Location: Right Arm)   Pulse (!) 102   Temp 98.1 F (36.7 C) (Oral)   Resp 20   Ht 5\' 5"  (1.651 m)   Wt 72.6 kg (160 lb)   SpO2 99%   BMI 26.63 kg/m   Physical Exam  Constitutional: She is oriented to person, place, and time. She appears well-developed and well-nourished.  HENT:  Head: Normocephalic and atraumatic.  Eyes: Pupils are equal, round, and reactive to light. Conjunctivae and EOM are normal. Left eye exhibits discharge.  No fluorescein uptake, normal slit-lamp exam, eyelid  everted, no visible foreign body mild conjunctival erythema and moderate watery discharge  Neck: Normal range of motion.  Cardiovascular: Normal rate.   Pulmonary/Chest: Effort normal.  Abdominal: She exhibits no distension.  Musculoskeletal: Normal range of motion.  Neurological: She is alert and oriented to person, place, and time.  Skin: Skin is dry.  Psychiatric: She has a normal mood and affect. Her behavior is normal. Judgment and thought content normal.  Nursing note and vitals reviewed.    ED Treatments / Results  Labs (all labs ordered are listed, but only abnormal results are displayed) Labs Reviewed - No data to display  EKG  EKG Interpretation None       Radiology No results found.  Procedures Procedures (including critical care time)  Medications Ordered in ED Medications  tetracaine (PONTOCAINE) 0.5 % ophthalmic solution 1 drop (not administered)  fluorescein ophthalmic strip 1 strip (not administered)  erythromycin ophthalmic ointment 1 application (not administered)     Initial Impression / Assessment and Plan / ED Course  I have reviewed the triage vital signs and the nursing notes.  Pertinent labs & imaging results that were available during my care of the patient were reviewed by me and considered in my medical decision making (see chart for details).    Patient with foreign body sensation in left eye.  Slit-lamp exam is normal. No fluorescein uptake. Eyelid everted with no visible foreign body.  Recommend follow-up with ophthalmology in 2 days if not better.  Final Clinical Impressions(s) / ED Diagnoses   Final diagnoses:  Sensation of foreign body in eye    New Prescriptions New Prescriptions   No medications on file     Roxy HorsemanBrowning, Vinisha Faxon, Cordelia Poche-C 08/11/16 0231    Zadie RhineWickline, Donald, MD 08/11/16 [phone]

## 2016-08-11 NOTE — Discharge Instructions (Signed)
No foreign body was seen in your eye today.  There were also no visible scratches.  Please follow-up with an eye doctor if not better in 24 hours.  Return here if your symptoms worsen.   Apply the antibiotic ointment every 4 hours for the next 5 days.

## 2016-08-12 NOTE — Telephone Encounter (Signed)
Tired to reach pt. Pt voicemail is full I could not leave a message. Will call  Later.

## 2016-08-23 NOTE — Telephone Encounter (Signed)
Left message for pt to return my call.

## 2018-10-08 ENCOUNTER — Other Ambulatory Visit: Payer: Self-pay

## 2018-10-08 ENCOUNTER — Other Ambulatory Visit (HOSPITAL_COMMUNITY)
Admission: RE | Admit: 2018-10-08 | Discharge: 2018-10-08 | Disposition: A | Discharge status: Home / Self Care | Payer: 59 | Source: Ambulatory Visit | Attending: Nurse Practitioner | Admitting: Nurse Practitioner

## 2018-10-08 DIAGNOSIS — Z124 Encounter for screening for malignant neoplasm of cervix: Secondary | ICD-10-CM | POA: Diagnosis present

## 2018-10-13 LAB — CYTOLOGY - PAP
Chlamydia: NEGATIVE
Diagnosis: NEGATIVE
High risk HPV: NEGATIVE
Molecular Disclaimer: 56
Molecular Disclaimer: DETECTED
Molecular Disclaimer: NEGATIVE
Molecular Disclaimer: NORMAL
Neisseria Gonorrhea: NEGATIVE

## 2019-03-23 ENCOUNTER — Ambulatory Visit (HOSPITAL_COMMUNITY)
Admission: EM | Admit: 2019-03-23 | Discharge: 2019-03-23 | Disposition: A | Discharge status: Home / Self Care | Payer: HRSA Program | Attending: Family Medicine | Admitting: Family Medicine

## 2019-03-23 ENCOUNTER — Other Ambulatory Visit: Payer: Self-pay

## 2019-03-23 ENCOUNTER — Encounter (HOSPITAL_COMMUNITY): Payer: Self-pay

## 2019-03-23 DIAGNOSIS — H7292 Unspecified perforation of tympanic membrane, left ear: Secondary | ICD-10-CM | POA: Insufficient documentation

## 2019-03-23 DIAGNOSIS — Z833 Family history of diabetes mellitus: Secondary | ICD-10-CM | POA: Insufficient documentation

## 2019-03-23 DIAGNOSIS — Z20822 Contact with and (suspected) exposure to covid-19: Secondary | ICD-10-CM | POA: Insufficient documentation

## 2019-03-23 DIAGNOSIS — H9202 Otalgia, left ear: Secondary | ICD-10-CM

## 2019-03-23 DIAGNOSIS — Z8249 Family history of ischemic heart disease and other diseases of the circulatory system: Secondary | ICD-10-CM | POA: Insufficient documentation

## 2019-03-23 DIAGNOSIS — H6690 Otitis media, unspecified, unspecified ear: Secondary | ICD-10-CM | POA: Diagnosis not present

## 2019-03-23 MED ORDER — IBUPROFEN 800 MG PO TABS: 800.0000 mg | ORAL_TABLET | Freq: Three times a day (TID) | ORAL | 0 refills | Status: AC

## 2019-03-23 MED ORDER — AMOXICILLIN 500 MG PO CAPS: 1000.0000 mg | ORAL_CAPSULE | Freq: Three times a day (TID) | ORAL | 0 refills | Status: AC

## 2019-03-23 MED ORDER — ACETAMINOPHEN 500 MG PO TABS: 500.0000 mg | ORAL_TABLET | Freq: Four times a day (QID) | ORAL | 0 refills | Status: AC | PRN

## 2019-03-23 NOTE — Discharge Instructions (Addendum)
Treating you for an ear infection Take the medication as prescribed.  Tylenol and ibuprofen for pain. If symptoms continue or worsen you need to follow-up with ear nose and throat specialist.

## 2019-03-23 NOTE — ED Triage Notes (Signed)
Pt c/o bilat ear fullness/pain; hoarseness, cough onset Sunday. Pt states she poured hydrogen peroxide in bilat ears yesterday and ear pain increased.  Also reports nasal drainage, frontal HA.   Denies fever, chills, abd pain, n/v/d.

## 2019-03-23 NOTE — ED Provider Notes (Signed)
[facility]    CSN: [id number] Arrival date & time: 03/23/19  1256      History   Chief Complaint Chief Complaint  Patient presents with  . Otalgia    HPI Savannah Cole is a 34 y.o. female.   Patient is a 34 year old female presents today with bilateral ear fullness, pain worse on the left.  Symptoms have been constant.  She has been taking ibuprofen for pain with mild relief.  Reporting prior to this starting she poured hydrogen peroxide in bilateral ears yesterday and ear pain increase at that time.  She has had mild cough, nasal drainage and frontal headache.  Denies any 58 fever, chills. No injuries to the ear or foreign bodies.   ROS per HPI      Past Medical History:  Diagnosis Date  . Anxiety   . CHF (congestive heart failure) (HCC)    During pregnancy  . Chlamydia   . Chronic diastolic heart failure (HCC)    postpartum; preeclampsia;  Echocardiogram 08/21/12: Mild LVH, EF 50%, normal wall motion, mild MR, PASP 34.  Marland Kitchen GERD (gastroesophageal reflux disease)   . Gestational diabetes    With first pregnancy  . Gonorrhea   . Heart murmur    Told in past. Told murmur recenlty in hospital.  . Hiatal hernia    a. Small-mod hiatal hernia seen on CT 07/2012.  Marland Kitchen Hypertension   . Preeclampsia    With first pregnancy and 07/2012 pregnancy.  . Pulmonary edema    a. Post-partum CHF 07/2012.    Patient Active Problem List   Diagnosis Date Noted  . Pain in the abdomen 05/16/2014  . Hyperglycemia 05/16/2014  . Loss of weight 05/16/2014  . Ventral hernia 05/16/2014  . Nausea without vomiting 05/16/2014  . History of CHF (congestive heart failure) 01/17/2014  . Generalized anxiety disorder 01/17/2014  . Constipation 01/17/2014  . Elevated LFTs 08/22/2012  . Acute diastolic heart failure (HCC) 08/21/2012  . Chronic hypertension with superimposed preeclampsia 08/10/2012  . Previous cesarean delivery, antepartum condition or complication 08/10/2012    Past  Surgical History:  Procedure Laterality Date  . CESAREAN SECTION    . DILATION AND CURETTAGE OF UTERUS     due to miscarriage    OB History    Gravida  6   Para  2   Term  1   Preterm  1   AB  4   Living  2     SAB  3   TAB      Ectopic      Multiple      Live Births  1            Home Medications    Prior to Admission medications   Medication Sig Start Date End Date Taking? Authorizing Provider  acetaminophen (TYLENOL) 500 MG tablet Take 1 tablet (500 mg total) by mouth every 6 (six) hours as needed. 03/23/19   Dahlia Byes A, NP  amoxicillin (AMOXIL) 500 MG capsule Take 2 capsules (1,000 mg total) by mouth 3 (three) times daily for 5 days. 03/23/19 03/28/19  Dahlia Byes A, NP  ibuprofen (ADVIL) 800 MG tablet Take 1 tablet (800 mg total) by mouth 3 (three) times daily. 03/23/19   Janace Aris, NP  promethazine (PHENERGAN) 12.5 MG tablet Take 1 tablet (12.5 mg total) by mouth every 8 (eight) hours as needed for nausea or vomiting. 05/16/14 03/23/19  Saguier, Kateri Mc    Family History Family  History  Problem Relation Age of Onset  . Hypertension Father   . Diabetes Father   . Fibromyalgia Mother   . Hypertension Maternal Grandmother   . Diabetes Maternal Grandmother     Social History Social History   Tobacco Use  . Smoking status: Never Smoker  . Smokeless tobacco: Never Used  Substance Use Topics  . Alcohol use: No    Alcohol/week: 2.0 standard drinks    Types: 2 Cans of beer per week    Comment: Not during pregnancy, otherwise rare on weekends  . Drug use: No     Allergies   Patient has no known allergies.   Review of Systems Review of Systems   Physical Exam Triage Vital Signs ED Triage Vitals  Enc Vitals Group     BP 03/23/19 1315 127/87     Pulse Rate 03/23/19 1315 92     Resp 03/23/19 1315 18     Temp 03/23/19 1315 99 F (37.2 C)     Temp Source 03/23/19 1315 Oral     SpO2 03/23/19 1315 100 %     Weight --      Height --       Head Circumference --      Peak Flow --      Pain Score 03/23/19 1308 6     Pain Loc --      Pain Edu? --      Excl. in [place]? --    No data found.  Updated Vital Signs BP 127/87 (BP Location: Left Arm)   Pulse 92   Temp 99 F (37.2 C) (Oral)   Resp 18   SpO2 100%   Visual Acuity Right Eye Distance:   Left Eye Distance:   Bilateral Distance:    Right Eye Near:   Left Eye Near:    Bilateral Near:     Physical Exam Vitals and nursing note reviewed.  Constitutional:      General: She is not in acute distress.    Appearance: Normal appearance. She is not ill-appearing, toxic-appearing or diaphoretic.  HENT:     Head: Normocephalic.     Right Ear: Tympanic membrane is erythematous and retracted.     Left Ear: Decreased hearing noted. Drainage present. Tympanic membrane is perforated.     Nose: Nose normal.  Eyes:     Conjunctiva/sclera: Conjunctivae normal.  Pulmonary:     Effort: Pulmonary effort is normal.  Abdominal:     Palpations: Abdomen is soft.  Musculoskeletal:        General: Normal range of motion.     Cervical back: Normal range of motion.  Skin:    General: Skin is warm and dry.     Findings: No rash.  Neurological:     Mental Status: She is alert.  Psychiatric:        Mood and Affect: Mood normal.      UC Treatments / Results  Labs (all labs ordered are listed, but only abnormal results are displayed) Labs Reviewed  NOVEL CORONAVIRUS, NAA (HOSP ORDER, SEND-OUT TO REF LAB; TAT 18-24 HRS)    EKG   Radiology No results found.  Procedures Procedures (including critical care time)  Medications Ordered in UC Medications - No data to display  Initial Impression / Assessment and Plan / UC Course  I have reviewed the triage vital signs and the nursing notes.  Pertinent labs & imaging results that were available during my care of the patient were reviewed  by me and considered in my medical decision making (see chart for details).      Right otitis media and left tympanic membrane rupture- treating infection with amoxicillin.  Ibuprofen and  Tylenol as needed for pain Recommended follow-up with ear nose and throat specialist if symptoms continue or worsen Final Clinical Impressions(s) / UC Diagnoses   Final diagnoses:  Otalgia of left ear  Acute otitis media, unspecified otitis media type  Tympanic membrane rupture, left     Discharge Instructions     Treating you for an ear infection Take the medication as prescribed.  Tylenol and ibuprofen for pain. If symptoms continue or worsen you need to follow-up with ear nose and throat specialist.    ED Prescriptions    Medication Sig Dispense Auth. Provider   ibuprofen (ADVIL) 800 MG tablet Take 1 tablet (800 mg total) by mouth 3 (three) times daily. 21 tablet Meline Russaw A, NP   acetaminophen (TYLENOL) 500 MG tablet Take 1 tablet (500 mg total) by mouth every 6 (six) hours as needed. 30 tablet Donnis Phaneuf A, NP   amoxicillin (AMOXIL) 500 MG capsule Take 2 capsules (1,000 mg total) by mouth 3 (three) times daily for 5 days. 30 capsule Shamir Tuzzolino A, NP     PDMP not reviewed this encounter.   Dahlia Byes A, NP 03/23/19 1418

## 2019-03-25 LAB — NOVEL CORONAVIRUS, NAA (HOSP ORDER, SEND-OUT TO REF LAB; TAT 18-24 HRS): SARS-CoV-2, NAA: NOT DETECTED
